# Patient Record
Sex: Female | Born: 1965 | Race: Black or African American | Hispanic: No | Marital: Single | Smoking: Current every day smoker
Health system: Southern US, Community
[De-identification: ages and names within clinical notes are randomized; demographics above are authoritative.]

## PROBLEM LIST (undated history)

## (undated) DIAGNOSIS — IMO0002 Reserved for concepts with insufficient information to code with codable children: Secondary | ICD-10-CM

## (undated) DIAGNOSIS — Z720 Tobacco use: Secondary | ICD-10-CM

## (undated) DIAGNOSIS — E119 Type 2 diabetes mellitus without complications: Secondary | ICD-10-CM

## (undated) DIAGNOSIS — I4729 Other ventricular tachycardia: Secondary | ICD-10-CM

## (undated) DIAGNOSIS — K219 Gastro-esophageal reflux disease without esophagitis: Secondary | ICD-10-CM

## (undated) DIAGNOSIS — I255 Ischemic cardiomyopathy: Secondary | ICD-10-CM

## (undated) DIAGNOSIS — I2119 ST elevation (STEMI) myocardial infarction involving other coronary artery of inferior wall: Secondary | ICD-10-CM

## (undated) DIAGNOSIS — I472 Ventricular tachycardia: Secondary | ICD-10-CM

## (undated) DIAGNOSIS — I10 Essential (primary) hypertension: Secondary | ICD-10-CM

## (undated) DIAGNOSIS — I2510 Atherosclerotic heart disease of native coronary artery without angina pectoris: Secondary | ICD-10-CM

## (undated) DIAGNOSIS — E079 Disorder of thyroid, unspecified: Secondary | ICD-10-CM

## (undated) DIAGNOSIS — E7800 Pure hypercholesterolemia, unspecified: Secondary | ICD-10-CM

## (undated) HISTORY — PX: THYROID SURGERY: SHX805

---

## 2008-05-17 ENCOUNTER — Emergency Department (HOSPITAL_BASED_OUTPATIENT_CLINIC_OR_DEPARTMENT_OTHER): Admission: EM | Admit: 2008-05-17 | Discharge: 2008-05-17 | Payer: Self-pay | Admitting: Emergency Medicine

## 2008-09-07 ENCOUNTER — Emergency Department (HOSPITAL_BASED_OUTPATIENT_CLINIC_OR_DEPARTMENT_OTHER): Admission: EM | Admit: 2008-09-07 | Discharge: 2008-09-08 | Payer: Self-pay | Admitting: Emergency Medicine

## 2008-09-08 ENCOUNTER — Ambulatory Visit: Payer: Self-pay | Admitting: Diagnostic Radiology

## 2009-03-17 ENCOUNTER — Emergency Department (HOSPITAL_BASED_OUTPATIENT_CLINIC_OR_DEPARTMENT_OTHER): Admission: EM | Admit: 2009-03-17 | Discharge: 2009-03-17 | Payer: Self-pay | Admitting: Emergency Medicine

## 2009-09-20 ENCOUNTER — Emergency Department (HOSPITAL_BASED_OUTPATIENT_CLINIC_OR_DEPARTMENT_OTHER): Admission: EM | Admit: 2009-09-20 | Discharge: 2009-09-20 | Payer: Self-pay | Admitting: Emergency Medicine

## 2010-01-06 ENCOUNTER — Emergency Department (HOSPITAL_BASED_OUTPATIENT_CLINIC_OR_DEPARTMENT_OTHER): Admission: EM | Admit: 2010-01-06 | Discharge: 2010-01-06 | Payer: Self-pay | Admitting: Emergency Medicine

## 2010-04-09 ENCOUNTER — Emergency Department (INDEPENDENT_AMBULATORY_CARE_PROVIDER_SITE_OTHER)
Admission: EM | Admit: 2010-04-09 | Discharge: 2010-04-09 | Payer: Self-pay | Source: Home / Self Care | Admitting: Emergency Medicine

## 2010-04-09 DIAGNOSIS — R4182 Altered mental status, unspecified: Secondary | ICD-10-CM

## 2010-04-09 LAB — URINALYSIS, ROUTINE W REFLEX MICROSCOPIC
Bilirubin Urine: NEGATIVE
Hgb urine dipstick: NEGATIVE
Specific Gravity, Urine: 1.021 (ref 1.005–1.030)
Urine Glucose, Fasting: NEGATIVE mg/dL
Urobilinogen, UA: 0.2 mg/dL (ref 0.0–1.0)

## 2010-04-09 LAB — POCT TOXICOLOGY PANEL

## 2010-04-09 LAB — COMPREHENSIVE METABOLIC PANEL
ALT: <6 U/L (ref 0–35)
Alkaline Phosphatase: 57 U/L (ref 39–117)
BUN: 19 mg/dL (ref 6–23)
CO2: 28 mEq/L (ref 19–32)
Calcium: 9.8 mg/dL (ref 8.4–10.5)
Chloride: 100 mEq/L (ref 96–112)
Creatinine, Ser: 1 mg/dL (ref 0.4–1.2)
GFR calc Af Amer: >60 mL/min (ref >60)
Glucose, Bld: 106 mg/dL — ABNORMAL HIGH (ref 70–99)

## 2010-04-09 LAB — CBC
HCT: 41.1 % (ref 36.0–46.0)
Hemoglobin: 14.2 g/dL (ref 12.0–15.0)
MCHC: 34.5 g/dL (ref 30.0–36.0)
MCV: 83.2 fL (ref 78.0–100.0)
RDW: 13.5 % (ref 11.5–15.5)
WBC: 7.1 10*3/uL (ref 4.0–10.5)

## 2010-04-09 LAB — DIFFERENTIAL
Eosinophils Relative: 3 % (ref 0–5)
Lymphocytes Relative: 45 % (ref 12–46)
Lymphs Abs: 3.2 10*3/uL (ref 0.7–4.0)
Monocytes Absolute: 0.6 10*3/uL (ref 0.1–1.0)
Neutro Abs: 3 10*3/uL (ref 1.7–7.7)

## 2010-06-13 ENCOUNTER — Emergency Department (HOSPITAL_BASED_OUTPATIENT_CLINIC_OR_DEPARTMENT_OTHER)
Admission: EM | Admit: 2010-06-13 | Discharge: 2010-06-13 | Disposition: A | Discharge status: Home / Self Care | Payer: Self-pay | Attending: Emergency Medicine | Admitting: Emergency Medicine

## 2010-06-13 DIAGNOSIS — R300 Dysuria: Secondary | ICD-10-CM | POA: Insufficient documentation

## 2010-06-13 DIAGNOSIS — I10 Essential (primary) hypertension: Secondary | ICD-10-CM | POA: Insufficient documentation

## 2010-06-13 DIAGNOSIS — N390 Urinary tract infection, site not specified: Secondary | ICD-10-CM | POA: Insufficient documentation

## 2010-06-13 DIAGNOSIS — K219 Gastro-esophageal reflux disease without esophagitis: Secondary | ICD-10-CM | POA: Insufficient documentation

## 2010-06-13 LAB — URINALYSIS, ROUTINE W REFLEX MICROSCOPIC
Glucose, UA: NEGATIVE mg/dL
pH: 5.5 (ref 5.0–8.0)

## 2010-06-13 LAB — URINE MICROSCOPIC-ADD ON

## 2010-08-24 ENCOUNTER — Emergency Department (HOSPITAL_BASED_OUTPATIENT_CLINIC_OR_DEPARTMENT_OTHER)
Admission: EM | Admit: 2010-08-24 | Discharge: 2010-08-24 | Disposition: A | Discharge status: Home / Self Care | Payer: Self-pay | Attending: Emergency Medicine | Admitting: Emergency Medicine

## 2010-08-24 DIAGNOSIS — R350 Frequency of micturition: Secondary | ICD-10-CM | POA: Insufficient documentation

## 2010-08-24 DIAGNOSIS — I10 Essential (primary) hypertension: Secondary | ICD-10-CM | POA: Insufficient documentation

## 2010-08-24 DIAGNOSIS — K219 Gastro-esophageal reflux disease without esophagitis: Secondary | ICD-10-CM | POA: Insufficient documentation

## 2010-08-24 DIAGNOSIS — N390 Urinary tract infection, site not specified: Secondary | ICD-10-CM | POA: Insufficient documentation

## 2010-08-24 LAB — URINALYSIS, ROUTINE W REFLEX MICROSCOPIC
Bilirubin Urine: NEGATIVE
Hgb urine dipstick: NEGATIVE
Specific Gravity, Urine: 1.025 (ref 1.005–1.030)
Urobilinogen, UA: 1 mg/dL (ref 0.0–1.0)

## 2010-08-24 LAB — URINE MICROSCOPIC-ADD ON

## 2010-08-26 LAB — URINE CULTURE: Culture  Setup Time: 201206121823

## 2010-09-24 ENCOUNTER — Emergency Department (HOSPITAL_BASED_OUTPATIENT_CLINIC_OR_DEPARTMENT_OTHER)
Admission: EM | Admit: 2010-09-24 | Discharge: 2010-09-24 | Disposition: A | Discharge status: Home / Self Care | Payer: Self-pay | Attending: Emergency Medicine | Admitting: Emergency Medicine

## 2010-09-24 DIAGNOSIS — R51 Headache: Secondary | ICD-10-CM | POA: Insufficient documentation

## 2010-09-24 DIAGNOSIS — F17200 Nicotine dependence, unspecified, uncomplicated: Secondary | ICD-10-CM | POA: Insufficient documentation

## 2010-09-24 DIAGNOSIS — T3795XA Adverse effect of unspecified systemic anti-infective and antiparasitic, initial encounter: Secondary | ICD-10-CM | POA: Insufficient documentation

## 2010-09-24 DIAGNOSIS — R220 Localized swelling, mass and lump, head: Secondary | ICD-10-CM | POA: Insufficient documentation

## 2010-09-24 DIAGNOSIS — R221 Localized swelling, mass and lump, neck: Secondary | ICD-10-CM | POA: Insufficient documentation

## 2010-09-24 DIAGNOSIS — T7840XA Allergy, unspecified, initial encounter: Secondary | ICD-10-CM

## 2010-09-24 DIAGNOSIS — R300 Dysuria: Secondary | ICD-10-CM | POA: Insufficient documentation

## 2010-09-24 DIAGNOSIS — L500 Allergic urticaria: Secondary | ICD-10-CM | POA: Insufficient documentation

## 2010-09-24 DIAGNOSIS — N390 Urinary tract infection, site not specified: Secondary | ICD-10-CM | POA: Insufficient documentation

## 2010-09-24 DIAGNOSIS — I10 Essential (primary) hypertension: Secondary | ICD-10-CM | POA: Insufficient documentation

## 2010-09-24 DIAGNOSIS — L299 Pruritus, unspecified: Secondary | ICD-10-CM | POA: Insufficient documentation

## 2010-09-24 HISTORY — DX: Essential (primary) hypertension: I10

## 2010-09-24 HISTORY — DX: Disorder of thyroid, unspecified: E07.9

## 2010-09-24 LAB — URINE MICROSCOPIC-ADD ON

## 2010-09-24 LAB — URINALYSIS, ROUTINE W REFLEX MICROSCOPIC
Bilirubin Urine: NEGATIVE
Glucose, UA: NEGATIVE mg/dL
Specific Gravity, Urine: 1.02 (ref 1.005–1.030)
Urobilinogen, UA: 1 mg/dL (ref 0.0–1.0)
pH: 6 (ref 5.0–8.0)

## 2010-09-24 MED ORDER — DIPHENHYDRAMINE HCL 25 MG PO CAPS
ORAL_CAPSULE | ORAL | Status: AC
  Filled 2010-09-24: qty 1

## 2010-09-24 MED ORDER — DIPHENHYDRAMINE HCL 25 MG PO CAPS: 25.0000 mg | ORAL_CAPSULE | Freq: Four times a day (QID) | ORAL | Status: AC | PRN

## 2010-09-24 MED ORDER — CIPROFLOXACIN HCL 500 MG PO TABS: 500.0000 mg | ORAL_TABLET | Freq: Two times a day (BID) | ORAL | Status: AC

## 2010-09-24 MED ORDER — DIPHENHYDRAMINE HCL 25 MG PO CAPS
25.0000 mg | ORAL_CAPSULE | Freq: Once | ORAL | Status: AC
  Administered 2010-09-24: 25 mg via ORAL
  Filled 2010-09-24: qty 1

## 2010-09-24 MED ORDER — CIPROFLOXACIN HCL 500 MG PO TABS
ORAL_TABLET | ORAL | Status: AC
  Filled 2010-09-24: qty 1

## 2010-09-24 MED ORDER — DIPHENHYDRAMINE HCL 25 MG PO CAPS: 25.0000 mg | ORAL_CAPSULE | Freq: Once | ORAL | Status: AC

## 2010-09-24 MED ORDER — CIPROFLOXACIN HCL 500 MG PO TABS
500.0000 mg | ORAL_TABLET | Freq: Once | ORAL | Status: AC
  Administered 2010-09-24: 500 mg via ORAL

## 2010-09-24 NOTE — ED Notes (Signed)
MD at bedside. 

## 2010-09-24 NOTE — ED Notes (Signed)
Was rx'd macrobid for UTI on 7/10 and states nausea/vomiting and cant eat since taking meds;  Last dose Wed night

## 2010-09-24 NOTE — ED Notes (Signed)
Pt ambulatory to [facility] for urine specimen. 

## 2010-09-24 NOTE — ED Provider Notes (Signed)
History     Chief Complaint  Patient presents with  . Allergic Reaction   HPI Comments: Pt had been seen on June 12 for UTI.  She was prescribed macrodantin.  She could not afford the prescription, and so waited until she had money.  She started the medicine on 09/21/10.  Now she has itching and swelling of the upper lip.  She also feels weak, always cold, has had nausea and headache.  She continues to have dysuria.    Patient is a 45 y.o. female presenting with allergic reaction. The history is provided by the patient. No language interpreter was used.  Allergic Reaction The primary symptoms are  urticaria. The current episode started 13 to 24 hours ago. The problem has not changed since onset. Location: Upper lip.  The onset of the reaction was associated with a new medication.    Past Medical History  Diagnosis Date  . Hypertension   . Thyroid disease     No past surgical history on file.  No family history on file.  History  Substance Use Topics  . Smoking status: Current Everyday Smoker  . Smokeless tobacco: Not on file  . Alcohol Use: Yes     1 beer q 2-3 days    OB History    Grav Para Term Preterm Abortions TAB SAB Ect Mult Living                  Review of Systems  Constitutional: Negative.   HENT: Positive for facial swelling.   Eyes: Negative.   Respiratory: Negative.   Cardiovascular: Negative.   Gastrointestinal: Negative.   Genitourinary: Positive for dysuria.  Skin:       Urticaria on upper lip.  Neurological: Positive for headaches.  Psychiatric/Behavioral: Negative.     Physical Exam  BP 112/74  Pulse 88  Temp(Src) 99.7 F (37.6 C) (Oral)  Resp 16  Physical Exam  Constitutional: She is oriented to person, place, and time. She appears well-developed and well-nourished. No distress.  HENT:  Head: Normocephalic and atraumatic.  Left Ear: External ear normal.  Mouth/Throat: Oropharynx is clear and moist. Oropharyngeal exudate: She has  urticarial swelling of the upper lip.  Eyes: Conjunctivae and EOM are normal. Pupils are equal, round, and reactive to light.  Neck: Normal range of motion. Neck supple.  Cardiovascular: Normal rate, regular rhythm and normal heart sounds.   Pulmonary/Chest: Effort normal and breath sounds normal.  Abdominal: Soft. Bowel sounds are normal.  Neurological: She is alert and oriented to person, place, and time.  Skin: Skin is warm and dry.  Psychiatric: She has a normal mood and affect. Her behavior is normal.    ED Course  Procedures  MDM  Rx for allergic reaction with Benadryl.  Reviewed sensitivities from her urine culture, and changed her to Cipro.      Carleene Cooper III, MD 09/24/10 [phone]

## 2010-09-26 LAB — URINE CULTURE
Colony Count: >=100000
Culture  Setup Time: 201207131419

## 2010-09-27 NOTE — ED Notes (Signed)
+    Urine culture. Patient treated with cipro-sensitive to same -chart appended per protocol MD.

## 2010-10-08 ENCOUNTER — Emergency Department (HOSPITAL_BASED_OUTPATIENT_CLINIC_OR_DEPARTMENT_OTHER)
Admission: EM | Admit: 2010-10-08 | Discharge: 2010-10-08 | Disposition: A | Discharge status: Home / Self Care | Payer: Self-pay | Attending: Emergency Medicine | Admitting: Emergency Medicine

## 2010-10-08 ENCOUNTER — Encounter (HOSPITAL_BASED_OUTPATIENT_CLINIC_OR_DEPARTMENT_OTHER): Payer: Self-pay

## 2010-10-08 DIAGNOSIS — R509 Fever, unspecified: Secondary | ICD-10-CM | POA: Insufficient documentation

## 2010-10-08 DIAGNOSIS — N390 Urinary tract infection, site not specified: Secondary | ICD-10-CM | POA: Insufficient documentation

## 2010-10-08 DIAGNOSIS — R350 Frequency of micturition: Secondary | ICD-10-CM | POA: Insufficient documentation

## 2010-10-08 DIAGNOSIS — R300 Dysuria: Secondary | ICD-10-CM | POA: Insufficient documentation

## 2010-10-08 LAB — URINALYSIS, ROUTINE W REFLEX MICROSCOPIC
Bilirubin Urine: NEGATIVE
Nitrite: POSITIVE — AB
Specific Gravity, Urine: 1.025 (ref 1.005–1.030)
Urobilinogen, UA: 1 mg/dL (ref 0.0–1.0)
pH: 6 (ref 5.0–8.0)

## 2010-10-08 LAB — URINE MICROSCOPIC-ADD ON

## 2010-10-08 MED ORDER — NAPROXEN SODIUM 220 MG PO TABS: 220.0000 mg | ORAL_TABLET | Freq: Two times a day (BID) | ORAL | Status: DC

## 2010-10-08 MED ORDER — CIPROFLOXACIN HCL 500 MG PO TABS: 500.0000 mg | ORAL_TABLET | Freq: Two times a day (BID) | ORAL | Status: AC

## 2010-10-08 MED ORDER — CIPROFLOXACIN HCL 500 MG PO TABS
500.0000 mg | ORAL_TABLET | Freq: Once | ORAL | Status: AC
  Administered 2010-10-08: 500 mg via ORAL
  Filled 2010-10-08: qty 1

## 2010-10-08 NOTE — ED Notes (Signed)
MD at bedside. 

## 2010-10-08 NOTE — ED Notes (Signed)
Urine collected in triage and taken to room with patient.

## 2010-10-08 NOTE — ED Provider Notes (Signed)
History     Chief Complaint  Patient presents with  . Dysuria  . Urinary Frequency  . Fever   HPI Comments: Presents today with continued urinary symptoms after losing her prescription for ciprofloxacin. Was seen here 2 weeks ago and placed on Macrobid. Developed an allergic reaction. At that visit was placed on Cipro however the patient lost the prescription. She continues to have dysuria, frequency, urgency. Subjective fevers but has not checked at home. She denies abdominal pain, nausea, vomiting, chest pain, shortness of breath  Patient is a 45 y.o. female presenting with dysuria. The history is provided by the patient. No language interpreter was used.  Dysuria  This is a recurrent problem. The current episode started more than 1 week ago (has been present for about 2 weeks). The problem occurs every urination. The problem has not changed since onset.The quality of the pain is described as burning. The pain is mild. There has been no fever. She is sexually active. Associated symptoms include frequency, hesitancy and urgency. Pertinent negatives include no chills, no sweats, no nausea, no vomiting, no hematuria and no flank pain. She has tried nothing for the symptoms. Her past medical history does not include kidney stones.    Past Medical History  Diagnosis Date  . Hypertension   . Thyroid disease     History reviewed. No pertinent past surgical history.  History reviewed. No pertinent family history.  History  Substance Use Topics  . Smoking status: Current Everyday Smoker  . Smokeless tobacco: Not on file  . Alcohol Use: Yes     1 beer q 2-3 days    OB History    Grav Para Term Preterm Abortions TAB SAB Ect Mult Living                  Review of Systems  Constitutional: Negative for fever, chills, activity change and appetite change.  HENT: Negative for congestion, sore throat, rhinorrhea, neck pain and neck stiffness.   Respiratory: Negative for chest tightness and  shortness of breath.   Gastrointestinal: Negative for nausea, vomiting and abdominal pain.  Genitourinary: Positive for dysuria, hesitancy, urgency and frequency. Negative for hematuria, flank pain, vaginal bleeding and vaginal discharge.  Neurological: Negative for light-headedness and headaches.  All other systems reviewed and are negative.    Physical Exam  BP 104/71  Pulse 95  Temp(Src) 98.8 F (37.1 C) (Oral)  Wt 125 lb (56.7 kg)  SpO2 100%  Physical Exam  Nursing note and vitals reviewed. Constitutional: She is oriented to person, place, and time. She appears well-developed and well-nourished. No distress.  HENT:  Head: Normocephalic and atraumatic.  Mouth/Throat: Oropharynx is clear and moist.  Eyes: Conjunctivae and EOM are normal. Pupils are equal, round, and reactive to light.  Neck: Normal range of motion. Neck supple.  Cardiovascular: Normal rate, regular rhythm, normal heart sounds and intact distal pulses.   No murmur heard. Pulmonary/Chest: Effort normal and breath sounds normal. No respiratory distress.  Abdominal: Soft. There is no tenderness.  Musculoskeletal: Normal range of motion.  Neurological: She is alert and oriented to person, place, and time.  Skin: Skin is warm. No rash noted.    ED Course  Procedures  MDM Urinary tract infection  Urinalysis is consistent with a urinary tract infection. Urine culture will be sent. First dose of Cipro will be administered numerous department. She'll be discharged home with a prescription for Cipro and encouraged to fill after leaving the emergency department. No  objective findings to suggest pyelonephritis although given the length of time the patient's symptoms I will treat with a longer course of Cipro.     Dayton Bailiff, MD 10/08/10 [phone]

## 2010-10-08 NOTE — ED Notes (Signed)
Pt reports she was seen on 09/21/10 in ED, diagnosed with a UTI and given Macrobid.  She developed an allergic reaction and was seen again in the ED on 09/24/2010.  States she received an Rx for ABX but was unable to get it filled for financial reasons and since has lost the Rx. SHe continues to have urinary frequency and burning with urination, as well as fever and chills.

## 2010-10-10 LAB — URINE CULTURE: Colony Count: >=100000

## 2010-10-11 NOTE — ED Notes (Signed)
+   urine Patient treated with Cipro-sensitive to same-chart appended per protocol MD. 

## 2010-12-09 ENCOUNTER — Emergency Department (HOSPITAL_BASED_OUTPATIENT_CLINIC_OR_DEPARTMENT_OTHER)
Admission: EM | Admit: 2010-12-09 | Discharge: 2010-12-09 | Disposition: A | Discharge status: Home / Self Care | Payer: Self-pay | Attending: Emergency Medicine | Admitting: Emergency Medicine

## 2010-12-09 ENCOUNTER — Encounter (HOSPITAL_BASED_OUTPATIENT_CLINIC_OR_DEPARTMENT_OTHER): Payer: Self-pay | Admitting: *Deleted

## 2010-12-09 DIAGNOSIS — S76219A Strain of adductor muscle, fascia and tendon of unspecified thigh, initial encounter: Secondary | ICD-10-CM

## 2010-12-09 DIAGNOSIS — M79609 Pain in unspecified limb: Secondary | ICD-10-CM | POA: Insufficient documentation

## 2010-12-09 DIAGNOSIS — X503XXA Overexertion from repetitive movements, initial encounter: Secondary | ICD-10-CM | POA: Insufficient documentation

## 2010-12-09 DIAGNOSIS — Y9289 Other specified places as the place of occurrence of the external cause: Secondary | ICD-10-CM | POA: Insufficient documentation

## 2010-12-09 DIAGNOSIS — IMO0002 Reserved for concepts with insufficient information to code with codable children: Secondary | ICD-10-CM | POA: Insufficient documentation

## 2010-12-09 DIAGNOSIS — F17200 Nicotine dependence, unspecified, uncomplicated: Secondary | ICD-10-CM | POA: Insufficient documentation

## 2010-12-09 DIAGNOSIS — E079 Disorder of thyroid, unspecified: Secondary | ICD-10-CM | POA: Insufficient documentation

## 2010-12-09 DIAGNOSIS — I10 Essential (primary) hypertension: Secondary | ICD-10-CM | POA: Insufficient documentation

## 2010-12-09 MED ORDER — IBUPROFEN 800 MG PO TABS
800.0000 mg | ORAL_TABLET | Freq: Once | ORAL | Status: AC
  Administered 2010-12-09: 800 mg via ORAL
  Filled 2010-12-09: qty 1

## 2010-12-09 MED ORDER — NAPROXEN 500 MG PO TABS: 500.0000 mg | ORAL_TABLET | Freq: Two times a day (BID) | ORAL | Status: DC

## 2010-12-09 MED ORDER — AMLODIPINE BESYLATE 10 MG PO TABS: 10.0000 mg | ORAL_TABLET | Freq: Every day | ORAL | Status: DC

## 2010-12-09 MED ORDER — TRAMADOL HCL 50 MG PO TABS
50.0000 mg | ORAL_TABLET | Freq: Once | ORAL | Status: AC
  Administered 2010-12-09: 50 mg via ORAL
  Filled 2010-12-09: qty 1

## 2010-12-09 MED ORDER — TRAMADOL HCL 50 MG PO TABS: 50.0000 mg | ORAL_TABLET | Freq: Four times a day (QID) | ORAL | Status: AC | PRN

## 2010-12-09 NOTE — ED Provider Notes (Signed)
History     CSN: [id number] Arrival date & time: 12/09/2010  8:02 AM  Chief Complaint  Patient presents with  . Leg Pain    (Consider location/radiation/quality/duration/timing/severity/associated sxs/prior treatment) HPI Comments: Patient works in a [employer], [employer]. Awoke this morning with pain to her right groin area. Has not tried any alleviating measures. No known trauma to her right hip. There is no appreciable swelling. No additional pain. No known injury  Patient is a 45 y.o. female presenting with leg pain. The history is provided by the patient. No language interpreter was used.  Leg Pain  The incident occurred 1 to 2 hours ago (awoke with pain this am). The incident occurred at home. There was no injury mechanism. The pain is present in the right hip. The quality of the pain is described as sharp and throbbing. The pain is moderate. The pain has been constant since onset. Pertinent negatives include no numbness, no inability to bear weight, no loss of motion, no muscle weakness, no loss of sensation and no tingling. She reports no foreign bodies present. The symptoms are aggravated by palpation, bearing weight and activity. She has tried nothing for the symptoms. The treatment provided no relief.    Past Medical History  Diagnosis Date  . Hypertension   . Thyroid disease     History reviewed. No pertinent past surgical history.  History reviewed. No pertinent family history.  History  Substance Use Topics  . Smoking status: Current Everyday Smoker  . Smokeless tobacco: Not on file  . Alcohol Use: Yes     1 beer q 2-3 days    OB History    Grav Para Term Preterm Abortions TAB SAB Ect Mult Living                  Review of Systems  Constitutional: Negative for fever, activity change and appetite change.  HENT: Negative for congestion, rhinorrhea, neck pain and neck stiffness.   Respiratory: Negative for cough, chest tightness and shortness of breath.     Cardiovascular: Negative for chest pain and palpitations.  Gastrointestinal: Negative for nausea, vomiting, abdominal pain and diarrhea.  Genitourinary: Negative for dysuria, urgency, frequency and flank pain.  Musculoskeletal: Negative for back pain.  Skin: Negative for rash.  Neurological: Negative for dizziness, tingling, numbness and headaches.  All other systems reviewed and are negative.    Allergies  Macrobid and Penicillins  Home Medications   Current Outpatient Rx  Name Route Sig Dispense Refill  . AMLODIPINE BESY-BENAZEPRIL HCL 5-10 MG PO CAPS Oral Take 1 capsule by mouth daily.      Marland Kitchen LEVOTHYROXINE SODIUM 25 MCG PO TABS Oral Take 25 mcg by mouth daily.      Marland Kitchen NAPROXEN SODIUM 220 MG PO TABS Oral Take 1 tablet (220 mg total) by mouth 2 (two) times daily with a meal. 30 tablet 0  . NITROFURANTOIN MONOHYD MACRO 100 MG PO CAPS Oral Take 100 mg by mouth 2 (two) times daily.      Marland Kitchen SPIRONOLACTONE 25 MG PO TABS Oral Take 25 mg by mouth daily.        BP 105/56  Pulse 81  Temp(Src) 98.5 F (36.9 C) (Oral)  Resp 20  SpO2 100%  LMP 12/08/2010  Physical Exam  Nursing note and vitals reviewed. Constitutional: She appears well-developed and well-nourished. No distress.       Uncomfortable in appearance  HENT:  Head: Normocephalic and atraumatic.  Mouth/Throat: No oropharyngeal exudate.  Eyes:  Conjunctivae and EOM are normal. Pupils are equal, round, and reactive to light.  Neck: Normal range of motion. Neck supple.  Cardiovascular: Normal rate, regular rhythm, normal heart sounds and intact distal pulses.   Pulmonary/Chest: Effort normal and breath sounds normal. No respiratory distress. She has no rales.  Abdominal: Soft. Bowel sounds are normal. There is no tenderness.  Musculoskeletal: Normal range of motion. She exhibits tenderness (Pain on palpation of the gracilis and sartorius muscles. There is no appreciable swelling. There is some palpable spasm. There is no  redness. Full range of motion of the hip without significant pain).  Skin: Skin is warm and dry. No rash noted.    ED Course  Procedures (including critical care time)  Labs Reviewed - No data to display No results found.   No diagnosis found.    MDM  A right groin strain  Her exam findings are consistent with a right groin strain. I'm not concerned about a hip fracture. Her pain is limited to the right groin therefore I am not concerned about a deep venous thrombosis. There is no indication for imaging at this time. I discussed the importance of ice and heat. I'll prescribe Naprosyn as well as a short course of Ultram. She is instructed to followup with her primary care physician        Dayton Bailiff, MD 12/09/10 [phone]

## 2010-12-09 NOTE — ED Notes (Signed)
Pt states she woke up this am with severe upper right thigh pain states she hasnt injured it as far as she knows . Pt states too painful to stand up. No swelling noted.

## 2011-04-08 ENCOUNTER — Emergency Department (INDEPENDENT_AMBULATORY_CARE_PROVIDER_SITE_OTHER): Payer: Self-pay

## 2011-04-08 ENCOUNTER — Emergency Department (HOSPITAL_BASED_OUTPATIENT_CLINIC_OR_DEPARTMENT_OTHER)
Admission: EM | Admit: 2011-04-08 | Discharge: 2011-04-08 | Disposition: A | Discharge status: Home / Self Care | Payer: Self-pay | Attending: Emergency Medicine | Admitting: Emergency Medicine

## 2011-04-08 ENCOUNTER — Encounter (HOSPITAL_BASED_OUTPATIENT_CLINIC_OR_DEPARTMENT_OTHER): Payer: Self-pay | Admitting: Family Medicine

## 2011-04-08 ENCOUNTER — Other Ambulatory Visit: Payer: Self-pay

## 2011-04-08 DIAGNOSIS — R079 Chest pain, unspecified: Secondary | ICD-10-CM

## 2011-04-08 DIAGNOSIS — F17200 Nicotine dependence, unspecified, uncomplicated: Secondary | ICD-10-CM | POA: Insufficient documentation

## 2011-04-08 DIAGNOSIS — I10 Essential (primary) hypertension: Secondary | ICD-10-CM | POA: Insufficient documentation

## 2011-04-08 DIAGNOSIS — E079 Disorder of thyroid, unspecified: Secondary | ICD-10-CM | POA: Insufficient documentation

## 2011-04-08 HISTORY — DX: Reserved for concepts with insufficient information to code with codable children: IMO0002

## 2011-04-08 LAB — BASIC METABOLIC PANEL
BUN: 14 mg/dL (ref 6–23)
CO2: 29 mEq/L (ref 19–32)
Calcium: 10.1 mg/dL (ref 8.4–10.5)
Chloride: 98 mEq/L (ref 96–112)
Creatinine, Ser: 1 mg/dL (ref 0.50–1.10)
GFR calc Af Amer: 77 mL/min — ABNORMAL LOW (ref >90)
GFR calc non Af Amer: 66 mL/min — ABNORMAL LOW (ref >90)
Glucose, Bld: 119 mg/dL — ABNORMAL HIGH (ref 70–99)
Potassium: 3.9 mEq/L (ref 3.5–5.1)
Sodium: 136 mEq/L (ref 135–145)

## 2011-04-08 LAB — CBC
HCT: 41.9 % (ref 36.0–46.0)
Hemoglobin: 14.4 g/dL (ref 12.0–15.0)
MCH: 29.4 pg (ref 26.0–34.0)
MCHC: 34.4 g/dL (ref 30.0–36.0)
MCV: 85.5 fL (ref 78.0–100.0)
Platelets: 276 10*3/uL (ref 150–400)
RBC: 4.9 MIL/uL (ref 3.87–5.11)
RDW: 13.9 % (ref 11.5–15.5)
WBC: 7.9 10*3/uL (ref 4.0–10.5)

## 2011-04-08 LAB — TROPONIN I: Troponin I: <0.3 ng/mL (ref <0.30)

## 2011-04-08 MED ORDER — ASPIRIN 81 MG PO CHEW
324.0000 mg | CHEWABLE_TABLET | Freq: Once | ORAL | Status: AC
  Administered 2011-04-08: 324 mg via ORAL
  Filled 2011-04-08: qty 4

## 2011-04-08 NOTE — ED Provider Notes (Signed)
History    45yF with chest pain. Pain is under the left breast. Describes the pain as sharp. Last 30 seconds to about a minute. No appreciable exacerbating or relieving factors. No shortness of breath. No radiation. Currently does not have. No fevers or chills. No cough. No unusual leg pain or swelling. Denies history of blood clots. Patient is a smoker. Denies exogenous estrogen use. No nausea, vomiting or diaphoresis. No rash or other skin changes.  CSN: [id number]  Arrival date & time 04/08/11  1119   First MD Initiated Contact with Patient 04/08/11 1139      Chief Complaint  Patient presents with  . Chest Pain    (Consider location/radiation/quality/duration/timing/severity/associated sxs/prior treatment) HPI  Past Medical History  Diagnosis Date  . Hypertension   . Thyroid disease   . Ulcer     Past Surgical History  Procedure Date  . Thyroid surgery     No family history on file.  History  Substance Use Topics  . Smoking status: Current Everyday Smoker  . Smokeless tobacco: Not on file  . Alcohol Use: Yes     1 beer q 2-3 days    OB History    Grav Para Term Preterm Abortions TAB SAB Ect Mult Living                  Review of Systems   Review of symptoms negative unless otherwise noted in HPI.   Allergies  Macrobid; Ciprofloxacin; and Penicillins  Home Medications   Current Outpatient Rx  Name Route Sig Dispense Refill  . LEVOTHYROXINE SODIUM 50 MCG PO TABS Oral Take 50 mcg by mouth daily.    Marland Kitchen NAPROXEN SODIUM 220 MG PO TABS Oral Take 220 mg by mouth 2 (two) times daily between meals as needed. For knee pain    . OMEPRAZOLE 20 MG PO CPDR Oral Take 20 mg by mouth 2 (two) times daily.    Marland Kitchen AMLODIPINE BESYLATE 10 MG PO TABS Oral Take 1 tablet (10 mg total) by mouth daily. 7 tablet 0  . AMLODIPINE BESY-BENAZEPRIL HCL 5-10 MG PO CAPS Oral Take 1 capsule by mouth daily.        BP 131/89  Pulse 85  Temp 97.8 F (36.6 C)  Resp 16  Ht 5' (1.524 m)   Wt 140 lb (63.504 kg)  BMI 27.34 kg/m2  SpO2 100%  LMP 03/08/2011  Physical Exam  Nursing note and vitals reviewed. Constitutional: She appears well-developed and well-nourished. No distress.       Sitting in bed. No acute distress.  HENT:  Head: Normocephalic and atraumatic.  Eyes: Conjunctivae are normal. Right eye exhibits no discharge. Left eye exhibits no discharge.  Neck: Normal range of motion. Neck supple.  Cardiovascular: Normal rate, regular rhythm and normal heart sounds.  Exam reveals no gallop and no friction rub.   No murmur heard.      Chest pain is not reproducible on palpation  Pulmonary/Chest: Effort normal and breath sounds normal. No respiratory distress.  Abdominal: Soft. She exhibits no distension. There is no tenderness.  Musculoskeletal: She exhibits no edema and no tenderness.  Lymphadenopathy:    She has no cervical adenopathy.  Neurological: She is alert.       Lower extremities symmetric in appearance. No edema. No calf tenderness. Negative Homans sign.  Skin: Skin is warm and dry. She is not diaphoretic.  Psychiatric: She has a normal mood and affect. Her behavior is normal. Thought content normal.  ED Course  Procedures (including critical care time)  Labs Reviewed  BASIC METABOLIC PANEL - Abnormal; Notable for the following:    Glucose, Bld 119 (*)    GFR calc non Af Amer 66 (*)    GFR calc Af Amer 77 (*)    All other components within normal limits  TROPONIN I  CBC   No results found.  EKG:  Rhythm: Normal sinus rhythm Rate: 72 Axis: Normal Intervals: Left atrial enlargement ST segments: Nonspecific ST changes. T wave flattening anteriorly. This is not significantly changed from patient's prior EKG.  12:15 PM. Chest x-ray was personally reviewed. Surgical clips in the neck area likely secondary to thyroidectomy. Bones are unremarkable. Trachea is midline. Heart is normal in appearance. Lungs are clear. No pleural effusions or  pneumothorax.  1. Chest pain       MDM  45yF with CP. Consider ACS but doubt. Chest pain is atypical in nature. Troponin is normal. EKG is not suggestive. No significant change in EKG from prior. Consider pulmonary embolism but doubt. Patient has no complaints of dyspnea, not tachycardic and no hypoxia. She does not use exogenous estrogen. No findings on physical exam to suggest DVT.Doubt infectious. Chest x-ray is clear. Etiology of chest pain is unclear at this point time but I feel the patient has been appropriate screened for urgent/emergent process. Discussed outpatient followup and the need to get cardiac stress testing. Do not feel that patient needs admission to hospital at this  Time though for further evaluation.      Raeford Razor, MD 04/08/11 1348

## 2011-04-08 NOTE — ED Notes (Signed)
Pt c/o chest pain, onset yesterday, under left breast that is intermittent lasting about 1 min at a time and pt sts "it feels like gas or a fullness". Pt denies cp at this time.  Pt denies associated symptoms. Pt sts she is currently being treated for "an ulcer".

## 2011-05-14 ENCOUNTER — Encounter (HOSPITAL_BASED_OUTPATIENT_CLINIC_OR_DEPARTMENT_OTHER): Payer: Self-pay | Admitting: *Deleted

## 2011-05-14 ENCOUNTER — Emergency Department (HOSPITAL_BASED_OUTPATIENT_CLINIC_OR_DEPARTMENT_OTHER)
Admission: EM | Admit: 2011-05-14 | Discharge: 2011-05-14 | Disposition: A | Discharge status: Home / Self Care | Payer: Self-pay | Attending: Emergency Medicine | Admitting: Emergency Medicine

## 2011-05-14 ENCOUNTER — Emergency Department (INDEPENDENT_AMBULATORY_CARE_PROVIDER_SITE_OTHER): Payer: Self-pay

## 2011-05-14 DIAGNOSIS — Z79899 Other long term (current) drug therapy: Secondary | ICD-10-CM | POA: Insufficient documentation

## 2011-05-14 DIAGNOSIS — R51 Headache: Secondary | ICD-10-CM | POA: Insufficient documentation

## 2011-05-14 DIAGNOSIS — I10 Essential (primary) hypertension: Secondary | ICD-10-CM | POA: Insufficient documentation

## 2011-05-14 DIAGNOSIS — H539 Unspecified visual disturbance: Secondary | ICD-10-CM | POA: Insufficient documentation

## 2011-05-14 DIAGNOSIS — J329 Chronic sinusitis, unspecified: Secondary | ICD-10-CM

## 2011-05-14 LAB — BASIC METABOLIC PANEL
Calcium: 9.3 mg/dL (ref 8.4–10.5)
Chloride: 96 mEq/L (ref 96–112)
Creatinine, Ser: 1 mg/dL (ref 0.50–1.10)
GFR calc Af Amer: 77 mL/min — ABNORMAL LOW (ref >90)
Sodium: 134 mEq/L — ABNORMAL LOW (ref 135–145)

## 2011-05-14 LAB — GLUCOSE, CAPILLARY: Glucose-Capillary: 81 mg/dL (ref 70–99)

## 2011-05-14 NOTE — ED Notes (Signed)
Pt states she has a hx of HTN and was recently taken off Spirolactone. For the past couple days, she has been monitoring her BP and it has been elevated. 147-149/93. Also c/o H/A "pulsating" and visual disturbances "lines" Denies other s/s.

## 2011-05-14 NOTE — ED Provider Notes (Signed)
History     CSN: [id number]  Arrival date & time 05/14/11  1810   First MD Initiated Contact with Patient 05/14/11 1827      Chief Complaint  Patient presents with  . Hypertension    (Consider location/radiation/quality/duration/timing/severity/associated sxs/prior treatment) HPI Comments: Pt states that she was taken off her spironolactone 3 weeks ago:pt states that she was supposed to follow up with her pcp last week but they canceled her appt:pt states that in the last week she has intermittent headaches and has had some lines in her vision:pt denies any loss or blurred vision:pt states that she has been taking bp at local drug store and it has been 140/90:pt denies cp or sob  Patient is a 46 y.o. female presenting with hypertension. The history is provided by the patient. No language interpreter was used.  Hypertension This is a chronic problem. The current episode started 1 to 4 weeks ago. The problem occurs intermittently. The problem has been unchanged. Associated symptoms include headaches. Pertinent negatives include no nausea or visual change. The symptoms are aggravated by nothing. She has tried nothing for the symptoms.    Past Medical History  Diagnosis Date  . Hypertension   . Thyroid disease   . Ulcer     Past Surgical History  Procedure Date  . Thyroid surgery     History reviewed. No pertinent family history.  History  Substance Use Topics  . Smoking status: Current Everyday Smoker  . Smokeless tobacco: Not on file  . Alcohol Use: Yes     1 beer q 2-3 days    OB History    Grav Para Term Preterm Abortions TAB SAB Ect Mult Living                  Review of Systems  Gastrointestinal: Negative for nausea.  Neurological: Positive for headaches.  All other systems reviewed and are negative.    Allergies  Macrobid; Ciprofloxacin; and Penicillins  Home Medications   Current Outpatient Rx  Name Route Sig Dispense Refill  . AMLODIPINE BESYLATE 10  MG PO TABS Oral Take 1 tablet (10 mg total) by mouth daily. 7 tablet 0  . LEVOTHYROXINE SODIUM 50 MCG PO TABS Oral Take 50 mcg by mouth daily.    Marland Kitchen OMEPRAZOLE 20 MG PO CPDR Oral Take 20 mg by mouth 2 (two) times daily.      BP 147/91  Pulse 84  Temp(Src) 98 F (36.7 C) (Oral)  Resp 20  Ht 5' (1.524 m)  Wt 150 lb (68.04 kg)  BMI 29.30 kg/m2  SpO2 100%  LMP 05/08/2011  Physical Exam  Nursing note and vitals reviewed. Constitutional: She is oriented to person, place, and time. She appears well-developed and well-nourished.  HENT:  Head: Normocephalic and atraumatic.  Right Ear: External ear normal.  Left Ear: External ear normal.  Eyes: Conjunctivae and EOM are normal. Pupils are equal, round, and reactive to light.  Neck: Normal range of motion. Neck supple.  Cardiovascular: Normal rate and regular rhythm.   Pulmonary/Chest: Effort normal and breath sounds normal.  Abdominal: Soft. Bowel sounds are normal. She exhibits no distension.  Musculoskeletal: Normal range of motion.  Neurological: She is alert and oriented to person, place, and time.  Skin: Skin is warm and dry.  Psychiatric: She has a normal mood and affect.    ED Course  Procedures (including critical care time)  Labs Reviewed  BASIC METABOLIC PANEL - Abnormal; Notable for the following:  Sodium 134 (*)    Potassium 3.4 (*)    GFR calc non Af Amer 66 (*)    GFR calc Af Amer 77 (*)    All other components within normal limits  GLUCOSE, CAPILLARY   Ct Head Wo Contrast  05/14/2011  *RADIOLOGY REPORT*  Clinical Data: Headache.  Hypertension  CT HEAD WITHOUT CONTRAST  Technique:  Contiguous axial images were obtained from the base of the skull through the vertex without contrast.  Comparison: 04/09/2010  Findings: Ventricles are normal.  Negative for intracranial hemorrhage.  Negative for infarct or mass.  Mild chronic sinusitis left maxillary sinus.  IMPRESSION: No significant intracranial abnormality.  Mild  chronic sinusitis.  Original Report Authenticated By: Camelia Phenes, M.D.     1. Hypertension   2. Headache       MDM  Pt neurologically intact:discussed with pt that we don't need to change her medications at this time:that she can follow up with her MWN:[id number] taking tylenol or motrin for the headache        Teressa Lower, NP 05/14/11 2016

## 2011-05-14 NOTE — Discharge Instructions (Signed)
Headache, General, Unknown Cause The specific cause of your headache may not have been found today. There are many causes and types of headache. A few common ones are:  Tension headache.   Migraine.   Infections (examples: dental and sinus infections).   Bone and/or joint problems in the neck or jaw.   Depression.   Eye problems.  These headaches are not life threatening.  Headaches can sometimes be diagnosed by a patient history and a physical exam. Sometimes, lab and imaging studies (such as x-ray and/or CT scan) are used to rule out more serious problems. In some cases, a spinal tap (lumbar puncture) may be requested. There are many times when your exam and tests may be normal on the first visit even when there is a serious problem causing your headaches. Because of that, it is very important to follow up with your doctor or local clinic for further evaluation. FINDING OUT THE RESULTS OF TESTS  If a radiology test was performed, a radiologist will review your results.   You will be contacted by the emergency department or your physician if any test results require a change in your treatment plan.   Not all test results may be available during your visit. If your test results are not back during the visit, make an appointment with your caregiver to find out the results. Do not assume everything is normal if you have not heard from your caregiver or the medical facility. It is important for you to follow up on all of your test results.  HOME CARE INSTRUCTIONS   Keep follow-up appointments with your caregiver, or any specialist referral.   Only take over-the-counter or prescription medicines for pain, discomfort, or fever as directed by your caregiver.   Biofeedback, massage, or other relaxation techniques may be helpful.   Ice packs or heat applied to the head and neck can be used. Do this three to four times per day, or as needed.   Call your doctor if you have any questions or  concerns.   If you smoke, you should quit.  SEEK MEDICAL CARE IF:   You develop problems with medications prescribed.   You do not respond to or obtain relief from medications.   You have a change from the usual headache.   You develop nausea or vomiting.  SEEK IMMEDIATE MEDICAL CARE IF:   If your headache becomes severe.   You have an unexplained oral temperature above 102 F (38.9 C), or as your caregiver suggests.   You have a stiff neck.   You have loss of vision.   You have muscular weakness.   You have loss of muscular control.   You develop severe symptoms different from your first symptoms.   You start losing your balance or have trouble walking.   You feel faint or pass out.  MAKE SURE YOU:   Understand these instructions.   Will watch your condition.   Will get help right away if you are not doing well or get worse.  Document Released: 02/28/2005 Document Revised: 11/10/2010 Document Reviewed: 10/18/2007 [facility] Patient Information 2012 [place], [place].Hypertension Information As your heart beats, it forces blood through your arteries. This force is your blood pressure. If the pressure is too high, it is called hypertension (HTN) or high blood pressure. HTN is dangerous because you may have it and not know it. High blood pressure may mean that your heart has to work harder to pump blood. Your arteries may be narrow or  stiff. The extra work puts you at risk for heart disease, stroke, and other problems.  Blood pressure consists of two numbers, a higher number over a lower, 110/72, for example. It is stated as "110 over 72." The ideal is below 120 for the top number (systolic) and under 80 for the bottom (diastolic).  You should pay close attention to your blood pressure if you have certain conditions such as:  Heart failure.   Prior heart attack.   Diabetes   Chronic kidney disease.   Prior stroke.   Multiple risk factors for heart disease.  To see if  you have HTN, your blood pressure should be measured while you are seated with your arm held at the level of the heart. It should be measured at least twice. A one-time elevated blood pressure reading (especially in the Emergency Department) does not mean that you need treatment. There may be conditions in which the blood pressure is different between your right and left arms. It is important to see your caregiver soon for a recheck. Most people have essential hypertension which means that there is not a specific cause. This type of high blood pressure may be lowered by changing lifestyle factors such as:  Stress.   Smoking.   Lack of exercise.   Excessive weight.   Drug/tobacco/alcohol use.   Eating less salt.  Most people do not have symptoms from high blood pressure until it has caused damage to the body. Effective treatment can often prevent, delay or reduce that damage. TREATMENT  Treatment for high blood pressure, when a cause has been identified, is directed at the cause. There are a large number of medications to treat HTN. These fall into several categories, and your caregiver will help you select the medicines that are best for you. Medications may have side effects. You should review side effects with your caregiver. If your blood pressure stays high after you have made lifestyle changes or started on medicines,   Your medication(s) may need to be changed.   Other problems may need to be addressed.   Be certain you understand your prescriptions, and know how and when to take your medicine.   Be sure to follow up with your caregiver within the time frame advised (usually within two weeks) to have your blood pressure rechecked and to review your medications.   If you are taking more than one medicine to lower your blood pressure, make sure you know how and at what times they should be taken. Taking two medicines at the same time can result in blood pressure that is too low.    Document Released: 05/03/2005 Document Revised: 11/10/2010 Document Reviewed: 05/10/2007 [facility] Patient Information 2012 [place], [place].

## 2011-05-14 NOTE — ED Provider Notes (Signed)
Medical screening examination/treatment/procedure(s) were performed by non-physician practitioner and as supervising physician I was immediately available for consultation/collaboration.   Jakaya Jacobowitz, MD 05/14/11 2017 

## 2013-02-27 ENCOUNTER — Emergency Department (HOSPITAL_BASED_OUTPATIENT_CLINIC_OR_DEPARTMENT_OTHER): Payer: 59

## 2013-02-27 ENCOUNTER — Emergency Department (HOSPITAL_BASED_OUTPATIENT_CLINIC_OR_DEPARTMENT_OTHER)
Admission: EM | Admit: 2013-02-27 | Discharge: 2013-02-27 | Disposition: A | Discharge status: Home / Self Care | Payer: 59 | Attending: Emergency Medicine | Admitting: Emergency Medicine

## 2013-02-27 ENCOUNTER — Encounter (HOSPITAL_BASED_OUTPATIENT_CLINIC_OR_DEPARTMENT_OTHER): Payer: Self-pay | Admitting: Emergency Medicine

## 2013-02-27 DIAGNOSIS — F17200 Nicotine dependence, unspecified, uncomplicated: Secondary | ICD-10-CM | POA: Insufficient documentation

## 2013-02-27 DIAGNOSIS — E7800 Pure hypercholesterolemia, unspecified: Secondary | ICD-10-CM | POA: Insufficient documentation

## 2013-02-27 DIAGNOSIS — Z872 Personal history of diseases of the skin and subcutaneous tissue: Secondary | ICD-10-CM | POA: Insufficient documentation

## 2013-02-27 DIAGNOSIS — Z3202 Encounter for pregnancy test, result negative: Secondary | ICD-10-CM | POA: Insufficient documentation

## 2013-02-27 DIAGNOSIS — K219 Gastro-esophageal reflux disease without esophagitis: Secondary | ICD-10-CM | POA: Insufficient documentation

## 2013-02-27 DIAGNOSIS — E079 Disorder of thyroid, unspecified: Secondary | ICD-10-CM | POA: Insufficient documentation

## 2013-02-27 DIAGNOSIS — I10 Essential (primary) hypertension: Secondary | ICD-10-CM | POA: Insufficient documentation

## 2013-02-27 DIAGNOSIS — J40 Bronchitis, not specified as acute or chronic: Secondary | ICD-10-CM

## 2013-02-27 DIAGNOSIS — Z79899 Other long term (current) drug therapy: Secondary | ICD-10-CM | POA: Insufficient documentation

## 2013-02-27 HISTORY — DX: Gastro-esophageal reflux disease without esophagitis: K21.9

## 2013-02-27 HISTORY — DX: Pure hypercholesterolemia, unspecified: E78.00

## 2013-02-27 LAB — URINALYSIS, ROUTINE W REFLEX MICROSCOPIC
Bilirubin Urine: NEGATIVE
Nitrite: NEGATIVE
Protein, ur: NEGATIVE mg/dL
Urobilinogen, UA: 0.2 mg/dL (ref 0.0–1.0)

## 2013-02-27 LAB — URINE MICROSCOPIC-ADD ON

## 2013-02-27 MED ORDER — ALBUTEROL SULFATE HFA 108 (90 BASE) MCG/ACT IN AERS
2.0000 | INHALATION_SPRAY | RESPIRATORY_TRACT | Status: DC | PRN
  Administered 2013-02-27: 2 via RESPIRATORY_TRACT
  Filled 2013-02-27: qty 6.7

## 2013-02-27 NOTE — ED Notes (Signed)
Pt reports has had 4 days of cough.  Also requesting to be checked for uti, reports urinates normally but then 'drips' afterwards.  Denies dysuria.

## 2013-02-27 NOTE — ED Notes (Signed)
MD at bedside. 

## 2013-02-27 NOTE — Patient Instructions (Signed)
Instructed patient on the proper use of administering albuteral mdi via aerochamber patient tolerated well 

## 2013-02-27 NOTE — ED Notes (Signed)
Patient transported to X-ray ambulatory with tech. 

## 2013-02-27 NOTE — ED Provider Notes (Signed)
CSN: [id number]     Arrival date & time 02/27/13  8295 History   First MD Initiated Contact with Patient 02/27/13 0346     Chief Complaint  Patient presents with  . Cough   (Consider location/radiation/quality/duration/timing/severity/associated sxs/prior Treatment) HPI This is a 47 year old female with a five-day history of cough. She has obtained some relief with over-the-counter Robitussin but is now having some baseline shortness of breath not worsened with exertion. She has not had any associated fever, chills, nausea, vomiting or diarrhea. The cough is nonproductive. She is also concerned she may have a urinary tract infection because she feels an abnormality at the end of her stream. This abnormality is difficult for her to characterize but is like that associated with a previous urinary tract infection.  Past Medical History  Diagnosis Date  . Hypertension   . Thyroid disease   . Ulcer   . GERD (gastroesophageal reflux disease)   . Hypercholesteremia    Past Surgical History  Procedure Laterality Date  . Thyroid surgery     No family history on file. History  Substance Use Topics  . Smoking status: Light Tobacco Smoker    Types: Cigarettes  . Smokeless tobacco: Not on file  . Alcohol Use: Yes     Comment: occ   OB History   Grav Para Term Preterm Abortions TAB SAB Ect Mult Living                 Review of Systems  All other systems reviewed and are negative.    Allergies  Nitrofurantoin monohyd macro; Ciprofloxacin; and Penicillins  Home Medications   Current Outpatient Rx  Name  Route  Sig  Dispense  Refill  . rosuvastatin (CRESTOR) 10 MG tablet   Oral   Take 10 mg by mouth daily.         Marland Kitchen spironolactone-hydrochlorothiazide (ALDACTAZIDE) 25-25 MG per tablet   Oral   Take 1 tablet by mouth daily.         Marland Kitchen EXPIRED: amLODipine (NORVASC) 10 MG tablet   Oral   Take 1 tablet (10 mg total) by mouth daily.   7 tablet   0   . levothyroxine  (SYNTHROID, LEVOTHROID) 50 MCG tablet   Oral   Take 50 mcg by mouth daily.         Marland Kitchen omeprazole (PRILOSEC) 20 MG capsule   Oral   Take 20 mg by mouth 2 (two) times daily.          BP 124/83  Pulse 68  Temp(Src) 97.9 F (36.6 C) (Oral)  Resp 20  Ht 5' (1.524 m)  Wt 137 lb (62.143 kg)  BMI 26.76 kg/m2  SpO2 100%  LMP 02/22/2013  Physical Exam General: Well-developed, well-nourished female in no acute distress; appearance consistent with age of record HENT: normocephalic; atraumatic Eyes: pupils equal, round and reactive to light; extraocular muscles intact Neck: supple Heart: regular rate and rhythm; no murmurs, rubs or gallops Lungs: clear to auscultation bilaterally; frequent dry cough Abdomen: soft; nondistended; mild suprapubic and epigastric tenderness; no masses or hepatosplenomegaly; bowel sounds present Extremities: No deformity; full range of motion; pulses normal Neurologic: Awake, alert and oriented; motor function intact in all extremities and symmetric; no facial droop Skin: Warm and dry Psychiatric: Normal mood and affect    ED Course  Procedures (including critical care time)    MDM   Nursing notes and vitals signs, including pulse oximetry, reviewed.  Summary of this visit's results, reviewed  by myself:  Labs:  Results for orders placed during the hospital encounter of 02/27/13 (from the past 24 hour(s))  URINALYSIS, ROUTINE W REFLEX MICROSCOPIC     Status: Abnormal   Collection Time    02/27/13  3:47 AM      Result Value Range   Color, Urine YELLOW  YELLOW   APPearance CLEAR  CLEAR   Specific Gravity, Urine 1.020  1.005 - 1.030   pH 5.5  5.0 - 8.0   Glucose, UA NEGATIVE  NEGATIVE mg/dL   Hgb urine dipstick LARGE (*) NEGATIVE   Bilirubin Urine NEGATIVE  NEGATIVE   Ketones, ur NEGATIVE  NEGATIVE mg/dL   Protein, ur NEGATIVE  NEGATIVE mg/dL   Urobilinogen, UA 0.2  0.0 - 1.0 mg/dL   Nitrite NEGATIVE  NEGATIVE   Leukocytes, UA TRACE (*)  NEGATIVE  PREGNANCY, URINE     Status: None   Collection Time    02/27/13  3:47 AM      Result Value Range   Preg Test, Ur NEGATIVE  NEGATIVE  URINE MICROSCOPIC-ADD ON     Status: Abnormal   Collection Time    02/27/13  3:47 AM      Result Value Range   Squamous Epithelial / LPF FEW (*) RARE   WBC, UA 0-2  <3 WBC/hpf   RBC / HPF 3-6  <3 RBC/hpf   Bacteria, UA FEW (*) RARE   Urine-Other MUCOUS PRESENT      Imaging Studies: CXR: negative for acute changes per Dr. Grace Isaac.       Hanley Seamen, MD 02/27/13 [phone]

## 2013-05-09 ENCOUNTER — Emergency Department (HOSPITAL_BASED_OUTPATIENT_CLINIC_OR_DEPARTMENT_OTHER): Payer: 59

## 2013-05-09 ENCOUNTER — Encounter (HOSPITAL_BASED_OUTPATIENT_CLINIC_OR_DEPARTMENT_OTHER): Payer: Self-pay | Admitting: Emergency Medicine

## 2013-05-09 ENCOUNTER — Emergency Department (HOSPITAL_BASED_OUTPATIENT_CLINIC_OR_DEPARTMENT_OTHER)
Admission: EM | Admit: 2013-05-09 | Discharge: 2013-05-09 | Disposition: A | Discharge status: Home / Self Care | Payer: 59 | Attending: Emergency Medicine | Admitting: Emergency Medicine

## 2013-05-09 DIAGNOSIS — F17200 Nicotine dependence, unspecified, uncomplicated: Secondary | ICD-10-CM | POA: Insufficient documentation

## 2013-05-09 DIAGNOSIS — Z79899 Other long term (current) drug therapy: Secondary | ICD-10-CM | POA: Insufficient documentation

## 2013-05-09 DIAGNOSIS — R05 Cough: Secondary | ICD-10-CM

## 2013-05-09 DIAGNOSIS — E079 Disorder of thyroid, unspecified: Secondary | ICD-10-CM | POA: Insufficient documentation

## 2013-05-09 DIAGNOSIS — Z8719 Personal history of other diseases of the digestive system: Secondary | ICD-10-CM | POA: Insufficient documentation

## 2013-05-09 DIAGNOSIS — Z872 Personal history of diseases of the skin and subcutaneous tissue: Secondary | ICD-10-CM | POA: Insufficient documentation

## 2013-05-09 DIAGNOSIS — I10 Essential (primary) hypertension: Secondary | ICD-10-CM | POA: Insufficient documentation

## 2013-05-09 DIAGNOSIS — Z8709 Personal history of other diseases of the respiratory system: Secondary | ICD-10-CM | POA: Insufficient documentation

## 2013-05-09 DIAGNOSIS — E7800 Pure hypercholesterolemia, unspecified: Secondary | ICD-10-CM | POA: Insufficient documentation

## 2013-05-09 DIAGNOSIS — R059 Cough, unspecified: Secondary | ICD-10-CM

## 2013-05-09 MED ORDER — ALBUTEROL SULFATE HFA 108 (90 BASE) MCG/ACT IN AERS: 1.0000 | INHALATION_SPRAY | Freq: Four times a day (QID) | RESPIRATORY_TRACT | Status: DC | PRN

## 2013-05-09 NOTE — ED Provider Notes (Signed)
Medical screening examination/treatment/procedure(s) were performed by non-physician practitioner and as supervising physician I was immediately available for consultation/collaboration.  EKG Interpretation   None         Delice Bison Brendin Situ, DO 05/09/13 1534

## 2013-05-09 NOTE — Discharge Instructions (Signed)

## 2013-05-09 NOTE — ED Notes (Signed)
Treated for bronchitis x 1 month ago.  States "I think I have it again"  I don't really feel bad

## 2013-05-09 NOTE — ED Provider Notes (Signed)
CSN: [id number]     Arrival date & time 05/09/13  1314 History   First MD Initiated Contact with Patient 05/09/13 1323     Chief Complaint  Patient presents with  . Shortness of Breath     (Consider location/radiation/quality/duration/timing/severity/associated sxs/prior Treatment) HPI Comments: Pt states that she was diagnosed with bronchitis about 1 month ago and she feels like she has it again. Pt states that she doesn't feels bad but she intermittently feels sob and has a cough. Pt states that multiple people in her house have been sick and some people have pneumonia and she just wanted to be safe. Denies cp, fever  The history is provided by the patient. No language interpreter was used.    Past Medical History  Diagnosis Date  . Hypertension   . Thyroid disease   . Ulcer   . GERD (gastroesophageal reflux disease)   . Hypercholesteremia    Past Surgical History  Procedure Laterality Date  . Thyroid surgery     No family history on file. History  Substance Use Topics  . Smoking status: Light Tobacco Smoker    Types: Cigarettes  . Smokeless tobacco: Not on file  . Alcohol Use: Yes     Comment: occ   OB History   Grav Para Term Preterm Abortions TAB SAB Ect Mult Living                 Review of Systems  Constitutional: Negative.   Respiratory: Positive for cough and shortness of breath.   Cardiovascular: Negative.       Allergies  Nitrofurantoin monohyd macro; Ciprofloxacin; and Penicillins  Home Medications   Current Outpatient Rx  Name  Route  Sig  Dispense  Refill  . levothyroxine (SYNTHROID, LEVOTHROID) 50 MCG tablet   Oral   Take 50 mcg by mouth daily.         . rosuvastatin (CRESTOR) 10 MG tablet   Oral   Take 10 mg by mouth daily.         Marland Kitchen spironolactone-hydrochlorothiazide (ALDACTAZIDE) 25-25 MG per tablet   Oral   Take 1 tablet by mouth daily.          BP 120/71  Pulse 84  Temp(Src) 98.2 F (36.8 C) (Oral)  SpO2 100%  LMP  04/14/2013 Physical Exam  Nursing note and vitals reviewed. Constitutional: She appears well-developed and well-nourished.  HENT:  Right Ear: External ear normal.  Left Ear: External ear normal.  Mouth/Throat: Oropharynx is clear and moist.  Cardiovascular: Normal rate and regular rhythm.   Pulmonary/Chest: Effort normal and breath sounds normal. She exhibits no tenderness.  Musculoskeletal: Normal range of motion.  Neurological: She is alert.    ED Course  Procedures (including critical care time) Labs Review Labs Reviewed - No data to display Imaging Review Dg Chest 2 View  05/09/2013   CLINICAL DATA:  Sinus congestion and pressure, bronchitis 1 month ago  EXAM: CHEST  2 VIEW  COMPARISON:  DG CHEST 2 VIEW dated 02/27/2013  FINDINGS: The heart size and mediastinal contours are within normal limits. Both lungs are clear. The visualized skeletal structures are unremarkable.  IMPRESSION: No active cardiopulmonary disease.   Electronically Signed   By: Skipper Cliche M.D.   On: 05/09/2013 14:29    EKG Interpretation   None       MDM   Final diagnoses:  Cough    Pt states that the inhaler that was recently given to her has helped and  she would like another one. Think that is reasonable at this time    Glendell Docker, NP 05/09/13 1524

## 2016-04-29 ENCOUNTER — Encounter (HOSPITAL_BASED_OUTPATIENT_CLINIC_OR_DEPARTMENT_OTHER): Payer: Self-pay

## 2016-04-29 ENCOUNTER — Emergency Department (HOSPITAL_BASED_OUTPATIENT_CLINIC_OR_DEPARTMENT_OTHER): Payer: 59

## 2016-04-29 ENCOUNTER — Emergency Department (HOSPITAL_BASED_OUTPATIENT_CLINIC_OR_DEPARTMENT_OTHER)
Admission: EM | Admit: 2016-04-29 | Discharge: 2016-04-29 | Disposition: A | Discharge status: Home / Self Care | Payer: 59 | Attending: Emergency Medicine | Admitting: Emergency Medicine

## 2016-04-29 DIAGNOSIS — J069 Acute upper respiratory infection, unspecified: Secondary | ICD-10-CM | POA: Diagnosis not present

## 2016-04-29 DIAGNOSIS — M779 Enthesopathy, unspecified: Secondary | ICD-10-CM | POA: Insufficient documentation

## 2016-04-29 DIAGNOSIS — Z79899 Other long term (current) drug therapy: Secondary | ICD-10-CM | POA: Insufficient documentation

## 2016-04-29 DIAGNOSIS — I10 Essential (primary) hypertension: Secondary | ICD-10-CM | POA: Insufficient documentation

## 2016-04-29 DIAGNOSIS — M778 Other enthesopathies, not elsewhere classified: Secondary | ICD-10-CM

## 2016-04-29 DIAGNOSIS — F17210 Nicotine dependence, cigarettes, uncomplicated: Secondary | ICD-10-CM | POA: Insufficient documentation

## 2016-04-29 DIAGNOSIS — R002 Palpitations: Secondary | ICD-10-CM | POA: Diagnosis present

## 2016-04-29 LAB — CBC WITH DIFFERENTIAL/PLATELET
BASOS PCT: 1 %
Basophils Absolute: 0 10*3/uL (ref 0.0–0.1)
EOS ABS: 0.2 10*3/uL (ref 0.0–0.7)
Eosinophils Relative: 3 %
HCT: 39.7 % (ref 36.0–46.0)
Hemoglobin: 13.4 g/dL (ref 12.0–15.0)
Lymphocytes Relative: 37 %
Lymphs Abs: 2.3 10*3/uL (ref 0.7–4.0)
MCH: 29.5 pg (ref 26.0–34.0)
MCHC: 33.8 g/dL (ref 30.0–36.0)
MCV: 87.3 fL (ref 78.0–100.0)
Monocytes Absolute: 0.6 10*3/uL (ref 0.1–1.0)
Monocytes Relative: 9 %
NEUTROS PCT: 50 %
Neutro Abs: 3.2 10*3/uL (ref 1.7–7.7)
PLATELETS: 280 10*3/uL (ref 150–400)
RBC: 4.55 MIL/uL (ref 3.87–5.11)
RDW: 13.1 % (ref 11.5–15.5)
WBC: 6.3 10*3/uL (ref 4.0–10.5)

## 2016-04-29 LAB — BASIC METABOLIC PANEL
Anion gap: 8 (ref 5–15)
BUN: 19 mg/dL (ref 6–20)
CO2: 29 mmol/L (ref 22–32)
CREATININE: 0.96 mg/dL (ref 0.44–1.00)
Calcium: 9.4 mg/dL (ref 8.9–10.3)
Chloride: 99 mmol/L — ABNORMAL LOW (ref 101–111)
GFR calc Af Amer: >60 mL/min (ref >60)
Glucose, Bld: 145 mg/dL — ABNORMAL HIGH (ref 65–99)
POTASSIUM: 3.5 mmol/L (ref 3.5–5.1)
SODIUM: 136 mmol/L (ref 135–145)

## 2016-04-29 LAB — MAGNESIUM: MAGNESIUM: 2 mg/dL (ref 1.7–2.4)

## 2016-04-29 LAB — TSH: TSH: 1.466 u[IU]/mL (ref 0.350–4.500)

## 2016-04-29 LAB — I-STAT TROPONIN, ED: Troponin i, poc: 0 ng/mL (ref 0.00–0.08)

## 2016-04-29 LAB — PREGNANCY, URINE: Preg Test, Ur: NEGATIVE

## 2016-04-29 MED ORDER — IBUPROFEN 800 MG PO TABS: 800.0000 mg | ORAL_TABLET | Freq: Three times a day (TID) | ORAL | 0 refills | Status: DC | PRN

## 2016-04-29 MED ORDER — FLUTICASONE PROPIONATE 50 MCG/ACT NA SUSP: 2.0000 | Freq: Every day | NASAL | 0 refills | Status: DC

## 2016-04-29 MED ORDER — SODIUM CHLORIDE 0.9 % IV BOLUS (SEPSIS)
1000.0000 mL | Freq: Once | INTRAVENOUS | Status: AC
  Administered 2016-04-29: 1000 mL via INTRAVENOUS

## 2016-04-29 NOTE — ED Triage Notes (Signed)
Pt states that since yesterday she "has been feeling an irregular heart beat in my thyroid.  Pt points to her throat and clavicle region where she is having the symptoms.  She denies pain, denies SOB, denies dizziness and denies the feeling radiating to the back or arms.

## 2016-04-29 NOTE — ED Provider Notes (Signed)
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 States she feels a "thumping" in her throat. No chest pain or chest discomfort. No shortness of breath, nausea, vomiting. No recent diarrhea, bloody stools or melena. Denies caffeine intake at all. No illicit drug use or stimulant use. Feels that she has been eating and drinking well. Recently seen by her endocrinologist and states her thyroid function was normal.  Patient also complains of 3 weeks of dry cough and nasal congestion. No fevers or chills.  Cough is been nonproductive. States she was told she had bronchitis. Has not been using any medications over-the-counter other than Benadryl.    Also complaining of right thumb and index finger pain. 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 She is right-hand dominant. No injury to the right hand. No redness, warmth or swelling.  ROS: See HPI Constitutional: no fever  Eyes: no drainage  ENT:  runny nose   Cardiovascular:  no chest pain  Resp: no SOB  GI: no vomiting GU: no dysuria Integumentary: no rash  Allergy: no hives  Musculoskeletal: no leg swelling  Neurological: no slurred speech ROS otherwise negative  PAST MEDICAL HISTORY/PAST SURGICAL HISTORY:  Past Medical History:  Diagnosis Date  . GERD (gastroesophageal reflux disease)   . Hypercholesteremia   . Hypertension   . Thyroid disease   . Ulcer ([place])     MEDICATIONS:  Prior to Admission medications   Medication Sig Start Date End Date Taking? Authorizing Provider  levothyroxine (SYNTHROID, LEVOTHROID) 50 MCG tablet Take 88 mcg by mouth daily.    Yes Historical Provider, MD  rosuvastatin (CRESTOR) 10 MG tablet Take 10 mg by mouth  daily.   Yes Historical Provider, MD  spironolactone-hydrochlorothiazide (ALDACTAZIDE) 25-25 MG per tablet Take 1 tablet by mouth daily.   Yes Historical Provider, MD  albuterol (PROVENTIL HFA;VENTOLIN HFA) 108 (90 BASE) MCG/ACT inhaler Inhale 1-2 puffs into the lungs every 6 (six) hours as needed for wheezing or shortness of breath. 05/09/13   Glendell Docker, NP    ALLERGIES:  Allergies  Allergen Reactions  . Nitrofurantoin Monohyd Macro Shortness Of Breath    rash  . Ciprofloxacin   . Penicillins Rash    SOCIAL HISTORY:  Social History  Substance Use Topics  . Smoking status: Light Tobacco Smoker    Types: Cigarettes  . Smokeless tobacco: Not on file  . Alcohol use Yes     Comment: occ    FAMILY HISTORY: No family history on file.  EXAM: BP 151/89 (BP Location: Right Arm)   Pulse 78   Temp 97.7 F (36.5 C) (Oral)   Resp 18   Ht 5' (1.524 m)   Wt 150 lb (68 kg)   LMP 03/29/2016   SpO2 100%   BMI 29.29 kg/m  CONSTITUTIONAL: Alert and oriented and responds appropriately to questions. Well-appearing; well-nourished HEAD: Normocephalic EYES: Conjunctivae clear, PERRL, EOMI ENT: normal nose; no rhinorrhea; moist mucous membranes NECK: Supple, no meningismus, no nuchal rigidity, no LAD, no swelling noted, trachea is midline  CARD: RRR; S1 and S2 appreciated; no murmurs, no clicks, no rubs, no gallops RESP: Normal chest excursion without splinting or tachypnea; breath sounds clear and equal bilaterally; no wheezes, no rhonchi, no rales,  no hypoxia or respiratory distress, speaking full sentences ABD/GI: Normal bowel sounds; non-distended; soft, non-tender, no rebound, no guarding, no peritoneal signs, no hepatosplenomegaly BACK:  The back appears normal and is non-tender to palpation, there is no CVA tenderness EXT: Tender to palpation over the right thumb and index finger diffusely without swelling, erythema or warmth, fluctuance or induration, ecchymosis or deformity. No  erythema or warmth. 2+ radial pulse on the right side. No paronychia, no herpetic whitlow, no felon, no flexor tenosynovitis.  No joint effusion. Normal ROM in all joints; non-tender to palpation; no edema; normal capillary refill; no cyanosis, no calf tenderness or swelling    SKIN: Normal color for age and race; warm; no rash NEURO: Moves all extremities equally, sensation to light touch intact diffusely, cranial nerves II through XII intact, normal speech PSYCH: The patient's mood and manner are appropriate. Grooming and personal hygiene are appropriate.  MEDICAL DECISION MAKING: Patient here with multiple complaints. First patient is complaining of palpitations. No chest pain or shortness of breath. States she does not drink caffeine on denies illicit drug use. Will hydrate patient, check electrolytes, blood counts, troponin today. EKG shows no ischemic abnormality, arrhythmia or interval changes. Also complaining of runny nose and cough for the past 3 weeks. Will obtain chest x-ray but doubt pneumonia. Also complaining of right thumb pain but has no injury to this area and no bony tenderness to suggest fracture. Does report repetitive movements at work. Suspect that this is tendinitis. Have recommended ibuprofen. No signs of septic arthritis, paronychia, herpetic whitlow, felon, flexor tenosynovitis on exam.  ED PROGRESS: Patient's workup here has been unremarkable. Normal electrolytes, negative troponin, clear chest x-ray. Patient has not had any events on the cardiac monitor. I recommend close outpatient follow-up with her endocrinologist. TSH is pending at this time I do not feel there is thyrotoxicosis, myxedema coma.  Have recommended rest, elevation and ice for her right pain. I recommended Flonase and Afrin as needed for nasal congestion and Mucinex as needed for cough. I do not feel an spikes are indicated. Recommend increased water intake at home. Discussed return precautions. She is comfortable  with this plan.   At this time, I do not feel there is any life-threatening condition present. I have reviewed and discussed all results (EKG, imaging, lab, urine as appropriate) and exam findings with patient/family. I have reviewed nursing notes and appropriate previous records.  I feel the patient is safe to be discharged home without further emergent workup and can continue workup as an outpatient as needed. Discussed usual and customary return precautions. Patient/family verbalize understanding and are comfortable with this plan.  Outpatient follow-up has been provided. All questions have been answered.    EKG Interpretation  Date/Time:  Friday April 29 2016 02:20:32 EST Ventricular Rate:  82 PR Interval:    QRS Duration: 84 QT Interval:  383 QTC Calculation: 448 R Axis:   68 Text Interpretation:  Sinus rhythm Right atrial enlargement Baseline wander in lead(s) I III aVL No significant change since last tracing Confirmed by WARD,  DO, KRISTEN [phone]) on 04/29/2016 2:55:58 AM         [place], DO 04/29/16 [id number]

## 2016-04-29 NOTE — ED Notes (Signed)
Pt with uncomfortable sensation at site over thyroidectomy x 36 hours denies pain SHOB N/V dizziness or other sx states that she had a similar  episode in the past when her K+ was low.

## 2016-04-29 NOTE — Progress Notes (Signed)
I-stat troponin result given to Dr Leonides Schanz.

## 2016-04-29 NOTE — Discharge Instructions (Signed)
You may alternate between Tylenol 1000 mg every 6 hours as needed for fever and pain and ibuprofen 800 mg every 8 hours as needed for fever and pain. Please drink plenty of water and avoid caffeine, alcohol. You may use over-the-counter Afrin nasal spray to help with nasal congestion but do not use this medication for more than 3 days in a row. I recommend using nasal steroid daily. Do not need antibiotics at this time. Please follow-up with your primary care physician and cardiology if palpitations continue.

## 2016-07-20 ENCOUNTER — Emergency Department (HOSPITAL_BASED_OUTPATIENT_CLINIC_OR_DEPARTMENT_OTHER)
Admission: EM | Admit: 2016-07-20 | Discharge: 2016-07-20 | Disposition: A | Discharge status: Home / Self Care | Payer: 59 | Attending: Emergency Medicine | Admitting: Emergency Medicine

## 2016-07-20 ENCOUNTER — Emergency Department (HOSPITAL_BASED_OUTPATIENT_CLINIC_OR_DEPARTMENT_OTHER): Payer: 59

## 2016-07-20 ENCOUNTER — Encounter (HOSPITAL_BASED_OUTPATIENT_CLINIC_OR_DEPARTMENT_OTHER): Payer: Self-pay

## 2016-07-20 DIAGNOSIS — F17210 Nicotine dependence, cigarettes, uncomplicated: Secondary | ICD-10-CM | POA: Insufficient documentation

## 2016-07-20 DIAGNOSIS — M6740 Ganglion, unspecified site: Secondary | ICD-10-CM

## 2016-07-20 DIAGNOSIS — I10 Essential (primary) hypertension: Secondary | ICD-10-CM | POA: Diagnosis not present

## 2016-07-20 DIAGNOSIS — Z79899 Other long term (current) drug therapy: Secondary | ICD-10-CM | POA: Insufficient documentation

## 2016-07-20 DIAGNOSIS — M25531 Pain in right wrist: Secondary | ICD-10-CM | POA: Diagnosis present

## 2016-07-20 DIAGNOSIS — M779 Enthesopathy, unspecified: Secondary | ICD-10-CM

## 2016-07-20 DIAGNOSIS — M67431 Ganglion, right wrist: Secondary | ICD-10-CM | POA: Insufficient documentation

## 2016-07-20 MED ORDER — NAPROXEN 500 MG PO TABS: 500.0000 mg | ORAL_TABLET | Freq: Two times a day (BID) | ORAL | 0 refills | Status: DC

## 2016-07-20 NOTE — ED Provider Notes (Signed)
[place] DEPT [facility] Provider Note   CSN: [id number] Arrival date & time: 07/20/16  1545     History   Chief Complaint Chief Complaint  Patient presents with  . Wrist Pain    HPI Brooke Williamson is a 51 y.o. female.  HPI Patient presents to the emergency room with complaints of bilateral wrist pain although primarily on the right side. Patient states pain migrates up to her forearms. The pain increases with movement and activity. She is also noticed a soft bump on the radial side of her wrist.  The area is tender to the touch. She denies any fevers or chills. No recent injuries. Patient does a lot of activity with her hands and arms at work. Past Medical History:  Diagnosis Date  . GERD (gastroesophageal reflux disease)   . Hypercholesteremia   . Hypertension   . Thyroid disease   . Ulcer     There are no active problems to display for this patient.   Past Surgical History:  Procedure Laterality Date  . THYROID SURGERY      OB History    No data available       Home Medications    Prior to Admission medications   Medication Sig Start Date End Date Taking? Authorizing Provider  levothyroxine (SYNTHROID, LEVOTHROID) 50 MCG tablet Take 88 mcg by mouth daily.     [provider]  naproxen (NAPROSYN) 500 MG tablet Take 1 tablet (500 mg total) by mouth 2 (two) times daily. 07/20/16   Dorie Rank, MD  rosuvastatin (CRESTOR) 10 MG tablet Take 10 mg by mouth daily.    [provider]  spironolactone-hydrochlorothiazide (ALDACTAZIDE) 25-25 MG per tablet Take 1 tablet by mouth daily.    [provider]    Family History No family history on file.  Social History Social History  Substance Use Topics  . Smoking status: Current Every Day Smoker    Types: Cigarettes  . Smokeless tobacco: Never Used  . Alcohol use Yes     Comment: occ     Allergies   Nitrofurantoin monohyd macro; Ciprofloxacin; and Penicillins   Review of  Systems Review of Systems  All other systems reviewed and are negative.    Physical Exam Updated Vital Signs BP 127/85 (BP Location: Left Arm)   Pulse 87   Temp 98.6 F (37 C) (Oral)   Resp 16   Ht 5' (1.524 m)   Wt 68 kg   LMP 03/29/2016   SpO2 100%   BMI 29.29 kg/m   Physical Exam  Constitutional: She appears well-developed and well-nourished. No distress.  HENT:  Head: Normocephalic and atraumatic.  Right Ear: External ear normal.  Left Ear: External ear normal.  Eyes: Conjunctivae are normal. Right eye exhibits no discharge. Left eye exhibits no discharge. No scleral icterus.  Neck: Neck supple. No tracheal deviation present.  Cardiovascular: Normal rate.   Pulmonary/Chest: Effort normal. No stridor. No respiratory distress.  Abdominal: She exhibits no distension.  Musculoskeletal: She exhibits tenderness. She exhibits no edema.       Right wrist: She exhibits tenderness and bony tenderness. She exhibits normal range of motion, no swelling and no effusion.       Left wrist: Normal.  Small approximately less than 1 cm raised lesion underneath the surface skin along the neck to the wrist, rubbery echotexture, soft to the touch  Neurological: She is alert. Cranial nerve deficit: no gross deficits.  Skin: Skin is warm and dry. No rash  noted.  Psychiatric: She has a normal mood and affect.  Nursing note and vitals reviewed.    ED Treatments / Results    Radiology Dg Wrist Complete Right  Result Date: 07/20/2016 CLINICAL DATA:  Radial sided wrist pain for 1 week. EXAM: RIGHT WRIST - COMPLETE 3+ VIEW COMPARISON:  None. FINDINGS: There is no evidence of fracture or dislocation. There is no evidence of arthropathy or other focal bone abnormality. Soft tissues are unremarkable. IMPRESSION: Unremarkable radiographs of the right wrist. Electronically Signed   By: Jeb Levering M.D.   On: 07/20/2016 17:00    Procedures Procedures (including critical care  time)  Medications Ordered in ED Medications - No data to display   Initial Impression / Assessment and Plan / ED Course  I have reviewed the triage vital signs and the nursing notes.  Pertinent labs & imaging results that were available during my care of the patient were reviewed by me and considered in my medical decision making (see chart for details).   No signs of infection.  No trauma.  May be related to overuse, tendonitis.  Ganglion cyst also noted.  Will refer to sports medicine.  Final Clinical Impressions(s) / ED Diagnoses   Final diagnoses:  Tendonitis  Ganglion cyst    New Prescriptions New Prescriptions   NAPROXEN (NAPROSYN) 500 MG TABLET    Take 1 tablet (500 mg total) by mouth 2 (two) times daily.     Dorie Rank, MD 07/20/16 [id number]

## 2016-07-20 NOTE — ED Triage Notes (Signed)
c/o pain to bilat wrist x 1 week-"knot" to left wrist-denies injury-NAD-steady gait

## 2016-07-20 NOTE — Discharge Instructions (Signed)
Use the splints for comfort, take the medications as needed for pain, follow up with a sports medicine doctor for further evaluation

## 2016-11-19 ENCOUNTER — Emergency Department (HOSPITAL_BASED_OUTPATIENT_CLINIC_OR_DEPARTMENT_OTHER): Payer: Self-pay

## 2016-11-19 ENCOUNTER — Encounter (HOSPITAL_BASED_OUTPATIENT_CLINIC_OR_DEPARTMENT_OTHER): Payer: Self-pay | Admitting: Emergency Medicine

## 2016-11-19 ENCOUNTER — Emergency Department (HOSPITAL_BASED_OUTPATIENT_CLINIC_OR_DEPARTMENT_OTHER)
Admission: EM | Admit: 2016-11-19 | Discharge: 2016-11-19 | Disposition: A | Discharge status: Home / Self Care | Payer: Self-pay | Attending: Physician Assistant | Admitting: Physician Assistant

## 2016-11-19 DIAGNOSIS — I10 Essential (primary) hypertension: Secondary | ICD-10-CM | POA: Insufficient documentation

## 2016-11-19 DIAGNOSIS — D1801 Hemangioma of skin and subcutaneous tissue: Secondary | ICD-10-CM | POA: Insufficient documentation

## 2016-11-19 DIAGNOSIS — G5603 Carpal tunnel syndrome, bilateral upper limbs: Secondary | ICD-10-CM | POA: Insufficient documentation

## 2016-11-19 DIAGNOSIS — F17210 Nicotine dependence, cigarettes, uncomplicated: Secondary | ICD-10-CM | POA: Insufficient documentation

## 2016-11-19 DIAGNOSIS — D1800 Hemangioma unspecified site: Secondary | ICD-10-CM

## 2016-11-19 DIAGNOSIS — Z79899 Other long term (current) drug therapy: Secondary | ICD-10-CM | POA: Insufficient documentation

## 2016-11-19 MED ORDER — NAPROXEN 500 MG PO TABS: 500.0000 mg | ORAL_TABLET | Freq: Two times a day (BID) | ORAL | 0 refills | Status: DC

## 2016-11-19 NOTE — Discharge Instructions (Signed)
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 Please follow up with [facility] for both these findings and for your carpal tunnel.

## 2016-11-19 NOTE — ED Triage Notes (Signed)
Pt reports her thumbs are tingling and tender. Hx of carpal tunnel but never got the rx filled. She also reports a piece of glass in her left foot for over a month.

## 2016-11-19 NOTE — ED Notes (Signed)
Patient transported to X-ray 

## 2016-11-19 NOTE — ED Provider Notes (Signed)
[place] DEPT [facility] Provider Note   CSN: [id number] Arrival date & time: 11/19/16  1205     History   Chief Complaint Chief Complaint  Patient presents with  . Foreign Body in Skin  . Carpal Tunnel    HPI Chrisie Jankovich is a 51 y.o. female.  HPI   51 year old female presenting with multiple complaints. One complaint is that she never got her prescription for Naprosyn last time she was here. Patient still having numbness tingling in her radial distribution. We'll give her wrist splints and the same follow-up as last time.  In addition patient she stepped on a piece glass a month ago until has a little bit of pain on the bottom of her left foot. Plan to get imaging  Past Medical History:  Diagnosis Date  . GERD (gastroesophageal reflux disease)   . Hypercholesteremia   . Hypertension   . Thyroid disease   . Ulcer     There are no active problems to display for this patient.   Past Surgical History:  Procedure Laterality Date  . THYROID SURGERY      OB History    No data available       Home Medications    Prior to Admission medications   Medication Sig Start Date End Date Taking? Authorizing Provider  levothyroxine (SYNTHROID, LEVOTHROID) 50 MCG tablet Take 88 mcg by mouth daily.    Yes [provider]  rosuvastatin (CRESTOR) 10 MG tablet Take 10 mg by mouth daily.   Yes [provider]  spironolactone-hydrochlorothiazide (ALDACTAZIDE) 25-25 MG per tablet Take 1 tablet by mouth daily.   Yes [provider]  naproxen (NAPROSYN) 500 MG tablet Take 1 tablet (500 mg total) by mouth 2 (two) times daily. 07/20/16   Dorie Rank, MD    Family History No family history on file.  Social History Social History  Substance Use Topics  . Smoking status: Current Every Day Smoker    Types: Cigarettes  . Smokeless tobacco: Never Used  . Alcohol use Yes     Comment: occ     Allergies   Nitrofurantoin monohyd macro; Ciprofloxacin;  and Penicillins   Review of Systems Review of Systems  Constitutional: Negative for activity change and fatigue.  HENT: Negative for drooling.   Respiratory: Negative for cough.   Cardiovascular: Negative for chest pain.  Gastrointestinal: Negative for abdominal distention.  Genitourinary: Negative for dysuria.  Musculoskeletal: Negative for joint swelling.  Skin: Negative for rash.  Psychiatric/Behavioral: Negative for behavioral problems.  All other systems reviewed and are negative.    Physical Exam Updated Vital Signs BP 127/79 (BP Location: Left Arm)   Pulse 77   Temp 98.2 F (36.8 C) (Oral)   Resp 16   Ht 5' (1.524 m)   Wt 68 kg (150 lb)   LMP 03/29/2016   SpO2 99%   BMI 29.29 kg/m   Physical Exam  Constitutional: She is oriented to person, place, and time. She appears well-developed and well-nourished.  HENT:  Head: Normocephalic and atraumatic.  Eyes: Right eye exhibits no discharge.  Cardiovascular: Normal rate and regular rhythm.   Pulmonary/Chest: Effort normal and breath sounds normal.  Musculoskeletal:  Slight discolaration on the bottom of left foot, <3 mm  Normal strength and mvomemetn of both hands/wrists.  Neurological: She is oriented to person, place, and time.  Skin: Skin is warm and dry. She is not diaphoretic.  Psychiatric: She has a normal mood and affect.  Nursing note and  vitals reviewed.    ED Treatments / Results  Labs (all labs ordered are listed, but only abnormal results are displayed) Labs Reviewed - No data to display  EKG  EKG Interpretation None       Radiology No results found.  Procedures Procedures (including critical care time)  Medications Ordered in ED Medications - No data to display   Initial Impression / Assessment and Plan / ED Course  I have reviewed the triage vital signs and the nursing notes.  Pertinent labs & imaging results that were available during my care of the patient were reviewed by me  and considered in my medical decision making (see chart for details).     51 year old female presenting with multiple complaints. One complaint is that she never got her prescription for Naprosyn last time she was here. Patient still having numbness tingling in her radial distribution. We'll give her wrist splints and the same follow-up as last time.  In addition patient she stepped on a piece glass a month ago until has a little bit of pain on the bottom of her left foot. 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 We'll refer to orthopedics.   Final Clinical Impressions(s) / ED Diagnoses   Final diagnoses:  None    New Prescriptions New Prescriptions   No medications on file     Macarthur Critchley, MD 11/19/16 1402

## 2017-07-15 ENCOUNTER — Encounter (HOSPITAL_BASED_OUTPATIENT_CLINIC_OR_DEPARTMENT_OTHER): Payer: Self-pay | Admitting: Emergency Medicine

## 2017-07-15 ENCOUNTER — Other Ambulatory Visit: Payer: Self-pay

## 2017-07-15 ENCOUNTER — Emergency Department (HOSPITAL_BASED_OUTPATIENT_CLINIC_OR_DEPARTMENT_OTHER)
Admission: EM | Admit: 2017-07-15 | Discharge: 2017-07-15 | Disposition: A | Discharge status: Home / Self Care | Payer: Self-pay | Attending: Emergency Medicine | Admitting: Emergency Medicine

## 2017-07-15 DIAGNOSIS — I10 Essential (primary) hypertension: Secondary | ICD-10-CM | POA: Insufficient documentation

## 2017-07-15 DIAGNOSIS — R202 Paresthesia of skin: Secondary | ICD-10-CM | POA: Insufficient documentation

## 2017-07-15 DIAGNOSIS — M5410 Radiculopathy, site unspecified: Secondary | ICD-10-CM | POA: Insufficient documentation

## 2017-07-15 DIAGNOSIS — F17210 Nicotine dependence, cigarettes, uncomplicated: Secondary | ICD-10-CM | POA: Insufficient documentation

## 2017-07-15 DIAGNOSIS — Z79899 Other long term (current) drug therapy: Secondary | ICD-10-CM | POA: Insufficient documentation

## 2017-07-15 MED ORDER — GABAPENTIN 100 MG PO CAPS: 100.0000 mg | ORAL_CAPSULE | Freq: Three times a day (TID) | ORAL | 0 refills | Status: DC

## 2017-07-15 NOTE — ED Triage Notes (Signed)
Patient states that she has numbness to her left first finger x 1 month. Reports that she has carpal tunnel, and a pinched nerve. Reports that she was placed on meloxicam and that helps the pain, but does not help the numbness and "it is so annoying"

## 2017-07-15 NOTE — Discharge Instructions (Signed)
Continue taking the meloxicam as directed.  You can take the gabapentin for nerve pain.  As we discussed, it is very important for you to follow-up with referred orthopedic doctor for further evaluation of your carpal tunnel syndrome.  Call their office and arrange for an appointment.  Return to the emergency department for any worsening numbness, discoloration of her finger, coolness of your finger, worsening pain, fever or any other worsening or concerning symptoms.

## 2017-07-15 NOTE — ED Provider Notes (Signed)
[place] EMERGENCY DEPARTMENT Provider Note   CSN: [id number] Arrival date & time: 07/15/17  1010     History   Chief Complaint Chief Complaint  Patient presents with  . Numbness    HPI Brooke Williamson is a 52 y.o. female with PMH/o GERD, Thyroid disease, Carpal Tunnel Syndrome who presents for evaluation of left index finger numbness that has been ongoing for 1 month.  Patient states that she can still feel but states that she has a tingling sensation in the left index finger that begins at the mid finger and extends distally.  Patient states that the symptoms have been going on for approximately 1 month and states that there is no new or changes in symptoms that brought her to the emergency department today.  Patient states that she came because "the symptoms are getting annoying."  Patient reports that she has a history of carpal tunnel syndrome to bilateral wrist.  Patient reports that she had been giving wrist splints and had been wearing them with improvement in her symptoms.  Patient recently started experiencing some similar pain in her left wrist and was seen by a adult health clinic.  Patient was prescribed some meloxicam at that time.  Patient states she never followed up with orthopedics.  Patient denies any preceding trauma, injury, fall to symptoms.  Patient states she has been able to function normally without any difficulty and denies any decreased grip strength.  Patient reports occasionally she will sometimes have some sharp shooting pain that extends from her neck down into her arm.  Patient denies any fevers, history of IV drug use, saddle anesthesia, urinary or bowel incontinence, history of back surgery, speech difficulty, vision changes.   The history is provided by the patient.    Past Medical History:  Diagnosis Date  . GERD (gastroesophageal reflux disease)   . Hypercholesteremia   . Hypertension   . Thyroid disease   . Ulcer     There are no  active problems to display for this patient.   Past Surgical History:  Procedure Laterality Date  . THYROID SURGERY       OB History   None      Home Medications    Prior to Admission medications   Medication Sig Start Date End Date Taking? Authorizing Provider  gabapentin (NEURONTIN) 100 MG capsule Take 1 capsule (100 mg total) by mouth 3 (three) times daily for 5 days. 07/15/17 07/20/17  Volanda Napoleon, PA-C  levothyroxine (SYNTHROID, LEVOTHROID) 50 MCG tablet Take 88 mcg by mouth daily.     [provider]  naproxen (NAPROSYN) 500 MG tablet Take 1 tablet (500 mg total) by mouth 2 (two) times daily. 07/20/16   Dorie Rank, MD  naproxen (NAPROSYN) 500 MG tablet Take 1 tablet (500 mg total) by mouth 2 (two) times daily. 11/19/16   Mackuen, Courteney Lyn, MD  rosuvastatin (CRESTOR) 10 MG tablet Take 10 mg by mouth daily.    [provider]  spironolactone-hydrochlorothiazide (ALDACTAZIDE) 25-25 MG per tablet Take 1 tablet by mouth daily.    [provider]    Family History History reviewed. No pertinent family history.  Social History Social History   Tobacco Use  . Smoking status: Current Every Day Smoker    Types: Cigarettes  . Smokeless tobacco: Never Used  Substance Use Topics  . Alcohol use: Yes    Comment: occ  . Drug use: No     Allergies   Nitrofurantoin monohyd macro;  Ciprofloxacin; and Penicillins   Review of Systems Review of Systems  Constitutional: Negative for fever.  Eyes: Negative for visual disturbance.  Musculoskeletal: Negative for back pain and neck pain.  Neurological: Positive for numbness (left index). Negative for weakness.     Physical Exam Updated Vital Signs BP (!) 135/98 (BP Location: Right Arm)   Pulse 86   Temp 98.3 F (36.8 C) (Oral)   Resp 18   Ht 5' (1.524 m)   Wt 64.9 kg (143 lb)   LMP 03/29/2016   SpO2 100%   BMI 27.93 kg/m   Physical Exam  Constitutional: She is oriented to person, place,  and time. She appears well-developed and well-nourished.  HENT:  Head: Normocephalic and atraumatic.  Mouth/Throat: Oropharynx is clear and moist and mucous membranes are normal.  Eyes: Pupils are equal, round, and reactive to light. Conjunctivae, EOM and lids are normal. Right eye exhibits no discharge. Left eye exhibits no discharge. No scleral icterus.  Neck: Full passive range of motion without pain.  Full flexion/extension and lateral movement of neck fully intact. No bony midline tenderness. No deformities or crepitus.  Positive Spurlings maneuver.   Cardiovascular: Normal pulses.  Pulses:      Radial pulses are 2+ on the right side, and 2+ on the left side.  Pulmonary/Chest: Effort normal.  Musculoskeletal: Normal range of motion.  Full range of motion of left wrist without any difficulty. Patient is easily able to make a fist without any difficulty.  Full flexion/extension of all 5 digits intact without difficulty. No deformity or crepitus of left index finger.  Full range of motion of left elbow, left shoulder.  Positive Phalen's on left side. No abnormalities of the right upper extremity.  Neurological: She is alert and oriented to person, place, and time.  Cranial nerves III-XII intact Follows commands, Moves all extremities  5/5 strength to BUE and BLE  Subjective decreased sensation noted to the left index finger that began just proximal to the PIP and extends distally.  Otherwise sensation intact throughout all major nerve distributions.  Patient is able to distinguish sharp versus dull sensation on all major dermatomes of the bilateral upper extremities, including the left index finger. Normal finger to nose. No dysdiadochokinesia. No pronator drift. No gait abnormalities  No slurred speech. No facial droop.   Skin: Skin is warm and dry. Capillary refill takes less than 2 seconds.  Good distal cap refill.  Left Index finger  is not dusky in appearance or cool to touch.    Psychiatric: She has a normal mood and affect. Her speech is normal and behavior is normal.  Nursing note and vitals reviewed.    ED Treatments / Results  Labs (all labs ordered are listed, but only abnormal results are displayed) Labs Reviewed - No data to display  EKG None  Radiology No results found.  Procedures Procedures (including critical care time)  Medications Ordered in ED Medications - No data to display   Initial Impression / Assessment and Plan / ED Course  I have reviewed the triage vital signs and the nursing notes.  Pertinent labs & imaging results that were available during my care of the patient were reviewed by me and considered in my medical decision making (see chart for details).     52 year old female who presents for evaluation of left index finger numbness.  This is been ongoing for approximately 1 month.  Patient has a history of carpal tunnel syndrome and states she has  been taking meloxicam which has been helping with the pain.  Patient still having some numbness to the left index finger.  She states she still will feel but describes a tingling sensation.  Patient also states she will occasionally have some shooting pain down her left upper extremity from her neck.  No vision changes, coldness, difficulty speaking, decreased strength. Patient is afebrile, non-toxic appearing, sitting comfortably on examination table. Vital signs reviewed and stable. Patient is neurovascularly intact.  On exam, patient reports decreased sensation to the left index finger that begins just proximal to the PIP joint and extends distally.  Although on neuro exam, patient is easily able to distinguish sharp versus dull on all major dermatome distributions of the bilateral upper extremities, including the left index finger.  Left index finger has good cap refill and does not appear dusky in appearance or cool to touch.  Consider radiculopathy versus carpal tunnel pain/numbness.   History/physical exam is not concerning for acute arterial embolism, septic arthritis, CVA.  Additionally, there is no signs on physical exam that would concerning for emboli to the finger.  Suspect this may be related to patient's carpal tunnel she is still having pain and has a positive Phalen's test today.  She has not been followed up for orthopedic.  We will plan to place her in a splint for support and stabilization.  We will plan to send home a short course of gabapentin.  Encourage patient to follow-up with outpatient orthopedic doctor for further evaluation and treatment of her symptoms.  There are no signs of acute emboli in the finger here on exam today.  I discussed with patient extensively regarding return precautions.  Instructed her to follow-up with ortho as directed. Patient had ample opportunity for questions and discussion. All patient's questions were answered with full understanding. Strict return precautions discussed. Patient expresses understanding and agreement to plan.   Final Clinical Impressions(s) / ED Diagnoses   Final diagnoses:  Radiculopathy, unspecified spinal region  Paresthesia    ED Discharge Orders        Ordered    gabapentin (NEURONTIN) 100 MG capsule  3 times daily     07/15/17 1043       Desma Mcgregor 07/15/17 1054    Blanchie Dessert, MD 07/15/17 2023

## 2017-09-17 ENCOUNTER — Other Ambulatory Visit: Payer: Self-pay

## 2017-09-17 ENCOUNTER — Encounter (HOSPITAL_BASED_OUTPATIENT_CLINIC_OR_DEPARTMENT_OTHER): Payer: Self-pay | Admitting: Emergency Medicine

## 2017-09-17 ENCOUNTER — Emergency Department (HOSPITAL_BASED_OUTPATIENT_CLINIC_OR_DEPARTMENT_OTHER)
Admission: EM | Admit: 2017-09-17 | Discharge: 2017-09-17 | Disposition: A | Discharge status: Home / Self Care | Payer: Self-pay | Attending: Emergency Medicine | Admitting: Emergency Medicine

## 2017-09-17 DIAGNOSIS — I10 Essential (primary) hypertension: Secondary | ICD-10-CM | POA: Insufficient documentation

## 2017-09-17 DIAGNOSIS — K0889 Other specified disorders of teeth and supporting structures: Secondary | ICD-10-CM | POA: Insufficient documentation

## 2017-09-17 DIAGNOSIS — Z79899 Other long term (current) drug therapy: Secondary | ICD-10-CM | POA: Insufficient documentation

## 2017-09-17 DIAGNOSIS — F17210 Nicotine dependence, cigarettes, uncomplicated: Secondary | ICD-10-CM | POA: Insufficient documentation

## 2017-09-17 MED ORDER — CLINDAMYCIN HCL 150 MG PO CAPS
450.0000 mg | ORAL_CAPSULE | Freq: Once | ORAL | Status: AC
  Administered 2017-09-17: 450 mg via ORAL
  Filled 2017-09-17: qty 3

## 2017-09-17 MED ORDER — NAPROXEN 250 MG PO TABS
500.0000 mg | ORAL_TABLET | Freq: Once | ORAL | Status: AC
  Administered 2017-09-17: 500 mg via ORAL
  Filled 2017-09-17: qty 2

## 2017-09-17 MED ORDER — NAPROXEN 500 MG PO TABS: 500.0000 mg | ORAL_TABLET | Freq: Two times a day (BID) | ORAL | 0 refills | Status: DC

## 2017-09-17 MED ORDER — PROBIOTIC 250 MG PO CAPS: 1.0000 | ORAL_CAPSULE | Freq: Every day | ORAL | 0 refills | Status: DC

## 2017-09-17 MED ORDER — CLINDAMYCIN HCL 150 MG PO CAPS: 450.0000 mg | ORAL_CAPSULE | Freq: Three times a day (TID) | ORAL | 0 refills | Status: AC

## 2017-09-17 NOTE — ED Provider Notes (Signed)
[facility] EMERG[facility] DEPARTMENT Provider Note   CSN: [id number] Arrival date & time: 09/17/17  1122     History   Chief Complaint Chief Complaint  Patient presents with  . Dental Pain    HPI Brooke Williamson is a 52 y.o. female with history of hypertension, hypercholesterolemia, GERD who presents with a 1 day history of dental pain.  Patient has had problems with her right lower molars in the past and does know she needs to get them out.  She reports subjective fevers yesterday.  She has had some pain radiating to her ear.  She has been taking Tylenol at home with minimal relief.  She denies any chest pain, shortness of breath, abdominal pain, nausea, vomiting.  HPI  Past Medical History:  Diagnosis Date  . GERD (gastroesophageal reflux disease)   . Hypercholesteremia   . Hypertension   . Thyroid disease   . Ulcer     There are no active problems to display for this patient.   Past Surgical History:  Procedure Laterality Date  . THYROID SURGERY       OB History   None      Home Medications    Prior to Admission medications   Medication Sig Start Date End Date Taking? Authorizing Provider  clindamycin (CLEOCIN) 150 MG capsule Take 3 capsules (450 mg total) by mouth 3 (three) times daily for 7 days. 09/17/17 09/24/17  Frederica Kuster, PA-C  gabapentin (NEURONTIN) 100 MG capsule Take 1 capsule (100 mg total) by mouth 3 (three) times daily for 5 days. 07/15/17 07/20/17  Volanda Napoleon, PA-C  levothyroxine (SYNTHROID, LEVOTHROID) 50 MCG tablet Take 88 mcg by mouth daily.     [provider]  naproxen (NAPROSYN) 500 MG tablet Take 1 tablet (500 mg total) by mouth 2 (two) times daily. 09/17/17   Amyra Vantuyl, Bea Graff, PA-C  rosuvastatin (CRESTOR) 10 MG tablet Take 10 mg by mouth daily.    [provider]  Saccharomyces boulardii (PROBIOTIC) 250 MG CAPS Take 1 capsule by mouth daily. 09/17/17   Luay Balding, Bea Graff, PA-C  spironolactone-hydrochlorothiazide  (ALDACTAZIDE) 25-25 MG per tablet Take 1 tablet by mouth daily.    [provider]    Family History History reviewed. No pertinent family history.  Social History Social History   Tobacco Use  . Smoking status: Current Every Day Smoker    Types: Cigarettes  . Smokeless tobacco: Never Used  Substance Use Topics  . Alcohol use: Yes    Comment: occ  . Drug use: No     Allergies   Nitrofurantoin monohyd macro; Ciprofloxacin; and Penicillins   Review of Systems Review of Systems  Constitutional: Positive for fever (subjective).  HENT: Positive for dental problem and ear pain.   Respiratory: Negative for shortness of breath.   Cardiovascular: Negative for chest pain.  Gastrointestinal: Negative for abdominal pain, nausea and vomiting.     Physical Exam Updated Vital Signs BP (!) 143/86 (BP Location: Left Arm)   Pulse 90   Temp 98.9 F (37.2 C) (Oral)   Resp 16   Ht 5' (1.524 m)   Wt 63.6 kg (140 lb 4.8 oz)   LMP 03/29/2016   SpO2 100%   BMI 27.40 kg/m   Physical Exam  Constitutional: She appears well-developed and well-nourished. No distress.  HENT:  Head: Normocephalic and atraumatic.  Right Ear: Tympanic membrane normal.  Left Ear: Tympanic membrane normal.  Mouth/Throat: Oropharynx is clear and moist. No oropharyngeal exudate.  No submandibular tenderness or masses  Eyes: Pupils are equal, round, and reactive to light. Conjunctivae are normal. Right eye exhibits no discharge. Left eye exhibits no discharge. No scleral icterus.  Neck: Normal range of motion. Neck supple. No thyromegaly present.  Cardiovascular: Normal rate, regular rhythm, normal heart sounds and intact distal pulses. Exam reveals no gallop and no friction rub.  No murmur heard. Pulmonary/Chest: Effort normal and breath sounds normal. No stridor. No respiratory distress. She has no wheezes. She has no rales.  Abdominal: Soft. Bowel sounds are normal. She exhibits no distension.  There is no tenderness. There is no rebound and no guarding.  Musculoskeletal: She exhibits no edema.  Lymphadenopathy:    She has no cervical adenopathy.  Neurological: She is alert. Coordination normal.  Skin: Skin is warm and dry. No rash noted. She is not diaphoretic. No pallor.  Psychiatric: She has a normal mood and affect.  Nursing note and vitals reviewed.    ED Treatments / Results  Labs (all labs ordered are listed, but only abnormal results are displayed) Labs Reviewed - No data to display  EKG None  Radiology No results found.  Procedures Procedures (including critical care time)  Medications Ordered in ED Medications  clindamycin (CLEOCIN) capsule 450 mg (has no administration in time range)  naproxen (NAPROSYN) tablet 500 mg (has no administration in time range)     Initial Impression / Assessment and Plan / ED Course  I have reviewed the triage vital signs and the nursing notes.  Pertinent labs & imaging results that were available during my care of the patient were reviewed by me and considered in my medical decision making (see chart for details).     Patient with dentalgia.  No abscess requiring immediate incision and drainage, however suspect developing abscess.  Exam not concerning for Ludwig's angina or pharyngeal abscess.  Will treat with clindamycin, probiotic, Naprosyn, Tylenol. Pt instructed to follow-up with dentist.  Patient plans to call tomorrow.  Discussed return precautions.  Patient understands and agrees with plan.  Patient vitals stable throughout ED course and discharged in satisfactory condition.  Final Clinical Impressions(s) / ED Diagnoses   Final diagnoses:  Pain, dental    ED Discharge Orders        Ordered    naproxen (NAPROSYN) 500 MG tablet  2 times daily     09/17/17 1245    clindamycin (CLEOCIN) 150 MG capsule  3 times daily     09/17/17 1245    Saccharomyces boulardii (PROBIOTIC) 250 MG CAPS  Daily     09/17/17 1245         Frederica Kuster, PA-C 09/17/17 1250    Davonna Belling, MD 09/17/17 1535

## 2017-09-17 NOTE — Discharge Instructions (Signed)
Take clindamycin as prescribed.  Recommend taking this with a probiotic which she can get at the pharmacy to help prevent stomach upset.  Continue taking Tylenol as needed for your pain.  You can additionally take Naprosyn twice daily.  Please call your dentist tomorrow and make an appointment as soon as possible for further management of your dental pain.  Please return the emergency department if you develop any new or worsening symptoms.

## 2017-09-17 NOTE — ED Triage Notes (Signed)
Patient states that she has pain to her right lower jaw  Since yesterday

## 2017-12-01 ENCOUNTER — Other Ambulatory Visit: Payer: Self-pay

## 2017-12-01 ENCOUNTER — Emergency Department (HOSPITAL_BASED_OUTPATIENT_CLINIC_OR_DEPARTMENT_OTHER)
Admission: EM | Admit: 2017-12-01 | Discharge: 2017-12-01 | Disposition: A | Discharge status: Home / Self Care | Payer: Self-pay | Attending: Emergency Medicine | Admitting: Emergency Medicine

## 2017-12-01 ENCOUNTER — Encounter (HOSPITAL_BASED_OUTPATIENT_CLINIC_OR_DEPARTMENT_OTHER): Payer: Self-pay | Admitting: Emergency Medicine

## 2017-12-01 DIAGNOSIS — I10 Essential (primary) hypertension: Secondary | ICD-10-CM | POA: Insufficient documentation

## 2017-12-01 DIAGNOSIS — M62830 Muscle spasm of back: Secondary | ICD-10-CM | POA: Insufficient documentation

## 2017-12-01 DIAGNOSIS — M545 Low back pain: Secondary | ICD-10-CM

## 2017-12-01 DIAGNOSIS — T148XXA Other injury of unspecified body region, initial encounter: Secondary | ICD-10-CM

## 2017-12-01 DIAGNOSIS — F17210 Nicotine dependence, cigarettes, uncomplicated: Secondary | ICD-10-CM | POA: Insufficient documentation

## 2017-12-01 DIAGNOSIS — Z79899 Other long term (current) drug therapy: Secondary | ICD-10-CM | POA: Insufficient documentation

## 2017-12-01 MED ORDER — CYCLOBENZAPRINE HCL 10 MG PO TABS: 10.0000 mg | ORAL_TABLET | Freq: Two times a day (BID) | ORAL | 0 refills | Status: DC | PRN

## 2017-12-01 MED ORDER — KETOROLAC TROMETHAMINE 15 MG/ML IJ SOLN
15.0000 mg | Freq: Once | INTRAMUSCULAR | Status: AC
  Administered 2017-12-01: 15 mg via INTRAMUSCULAR
  Filled 2017-12-01: qty 1

## 2017-12-01 MED ORDER — NAPROXEN 500 MG PO TABS: 500.0000 mg | ORAL_TABLET | Freq: Two times a day (BID) | ORAL | 0 refills | Status: AC

## 2017-12-01 NOTE — ED Notes (Signed)
ED Provider at bedside. 

## 2017-12-01 NOTE — ED Provider Notes (Signed)
[facility] EM[facility] DEPARTMENT Provider Note   CSN: [id number] Arrival date & time: 12/01/17  6546     History   Chief Complaint Chief Complaint  Patient presents with  . Back Pain    HPI Brooke Williamson is a 52 y.o. female.  The history is provided by the patient.  Back Pain   This is a new problem. The problem occurs daily. The problem has not changed since onset.The pain is associated with no known injury. The pain is present in the lumbar spine. The quality of the pain is described as shooting. The pain radiates to the right thigh. The pain is at a severity of 5/10. The pain is moderate. The symptoms are aggravated by certain positions. Pertinent negatives include no chest pain, no fever, no numbness, no weight loss, no headaches, no abdominal pain, no abdominal swelling, no bowel incontinence, no perianal numbness, no bladder incontinence, no dysuria, no pelvic pain, no leg pain, no paresthesias, no paresis, no tingling and no weakness. She has tried NSAIDs for the symptoms. The treatment provided mild relief.    Past Medical History:  Diagnosis Date  . GERD (gastroesophageal reflux disease)   . Hypercholesteremia   . Hypertension   . Thyroid disease   . Ulcer     There are no active problems to display for this patient.   Past Surgical History:  Procedure Laterality Date  . THYROID SURGERY       OB History   None      Home Medications    Prior to Admission medications   Medication Sig Start Date End Date Taking? Authorizing Provider  levothyroxine (SYNTHROID, LEVOTHROID) 88 MCG tablet  11/22/17  Yes [provider]  spironolactone-hydrochlorothiazide (ALDACTAZIDE) 25-25 MG per tablet Take 1 tablet by mouth daily.   Yes [provider]  cyclobenzaprine (FLEXERIL) 10 MG tablet Take 1 tablet (10 mg total) by mouth 2 (two) times daily as needed for up to 12 doses for muscle spasms. 12/01/17   Gust Eugene, DO  naproxen  (NAPROSYN) 500 MG tablet Take 1 tablet (500 mg total) by mouth 2 (two) times daily for 30 doses. 12/01/17 12/16/17  Monika Chestang, DO  Saccharomyces boulardii (PROBIOTIC) 250 MG CAPS Take 1 capsule by mouth daily. 09/17/17   Frederica Kuster, PA-C    Family History No family history on file.  Social History Social History   Tobacco Use  . Smoking status: Current Every Day Smoker    Types: Cigarettes  . Smokeless tobacco: Never Used  Substance Use Topics  . Alcohol use: Yes    Comment: occ  . Drug use: No     Allergies   Nitrofurantoin monohyd macro; Ciprofloxacin; and Penicillins   Review of Systems Review of Systems  Constitutional: Negative for chills, fever and weight loss.  HENT: Negative for ear pain and sore throat.   Eyes: Negative for pain and visual disturbance.  Respiratory: Negative for cough and shortness of breath.   Cardiovascular: Negative for chest pain and palpitations.  Gastrointestinal: Negative for abdominal pain, bowel incontinence and vomiting.  Genitourinary: Negative for bladder incontinence, dysuria, hematuria and pelvic pain.  Musculoskeletal: Positive for back pain. Negative for arthralgias, gait problem, joint swelling, neck pain and neck stiffness.  Skin: Negative for color change and rash.  Neurological: Negative for tingling, seizures, syncope, weakness, numbness, headaches and paresthesias.  All other systems reviewed and are negative.    Physical Exam Updated Vital Signs  ED Triage Vitals  Enc  Vitals Group     BP 12/01/17 0922 118/70     Pulse Rate 12/01/17 0922 86     Resp 12/01/17 0922 16     Temp 12/01/17 0922 98.9 F (37.2 C)     Temp Source 12/01/17 0922 Oral     SpO2 12/01/17 0922 100 %     Weight 12/01/17 0922 135 lb (61.2 kg)     Height 12/01/17 0922 5' (1.524 m)     Head Circumference --      Peak Flow --      Pain Score 12/01/17 0921 7     Pain Loc --      Pain Edu? --      Excl. in [place]? --     Physical Exam    Constitutional: She is oriented to person, place, and time. She appears well-developed and well-nourished. No distress.  HENT:  Head: Normocephalic and atraumatic.  Eyes: Pupils are equal, round, and reactive to light. Conjunctivae and EOM are normal.  Neck: Normal range of motion. Neck supple.  Cardiovascular: Normal rate and regular rhythm.  No murmur heard. Pulmonary/Chest: Effort normal and breath sounds normal. No respiratory distress.  Abdominal: Soft. There is no tenderness.  Musculoskeletal: Normal range of motion. She exhibits tenderness (TTP to paraspinal lumbar muscles on right, no midline tenderness). She exhibits no edema.  Neurological: She is alert and oriented to person, place, and time. No cranial nerve deficit or sensory deficit. She exhibits normal muscle tone. Coordination normal.  5+/5 strength and normal sensation throughout, normal gait  Skin: Skin is warm and dry.  Psychiatric: She has a normal mood and affect.  Nursing note and vitals reviewed.    ED Treatments / Results  Labs (all labs ordered are listed, but only abnormal results are displayed) Labs Reviewed - No data to display  EKG None  Radiology No results found.  Procedures Procedures (including critical care time)  Medications Ordered in ED Medications  ketorolac (TORADOL) 15 MG/ML injection 15 mg (has no administration in time range)     Initial Impression / Assessment and Plan / ED Course  I have reviewed the triage vital signs and the nursing notes.  Pertinent labs & imaging results that were available during my care of the patient were reviewed by me and considered in my medical decision making (see chart for details).     Brooke Williamson is a 52 year old female with no significant medical history who presents to the ED with right-sided back pain.  Patient with normal vitals.  No fever.  No history of trauma.  No red flag signs including no loss of bowel or bladder, no IV drug  use, no fever.  No concern for cauda equina.  Patient is tender in the right sided paraspinal muscles.  Consistent with muscle spasm.  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  Had this happen to her about 3 years ago.  Has not taken anything yet medication wise.  Patient is neurologically intact.  Fairly normal gait.  No concern for cord process.  No midline spinal tenderness.  Given Toradol in the ED IM.  Given prescription for Naprosyn and Flexeril.  Recommend follow-up with primary care doctor and discharged from ED in good condition.  Given information about back exercises.  This chart was dictated using voice recognition software.  Despite best efforts to proofread,  errors can occur which can change the documentation meaning.   Final Clinical Impressions(s) /  ED Diagnoses   Final diagnoses:  Acute right-sided low back pain, with sciatica presence unspecified  Muscle strain  Muscle spasm of back    ED Discharge Orders         Ordered    naproxen (NAPROSYN) 500 MG tablet  2 times daily     12/01/17 0936    cyclobenzaprine (FLEXERIL) 10 MG tablet  2 times daily PRN     12/01/17 0936           Lennice Sites, DO 12/01/17 [phone]

## 2017-12-01 NOTE — ED Triage Notes (Signed)
Right sided lower back pain since Monday.  Pain is "shooting down the back of my thigh"

## 2018-03-09 IMAGING — DX DG CHEST 2V
2 series · 2 of 2 positions shown · non-contrast
Comparison: Chest radiograph May 09, 2013

CLINICAL DATA: Palpitations and cough, recent bronchitis.  Smoker.

EXAM:
CHEST  2 VIEW

[chest pa]
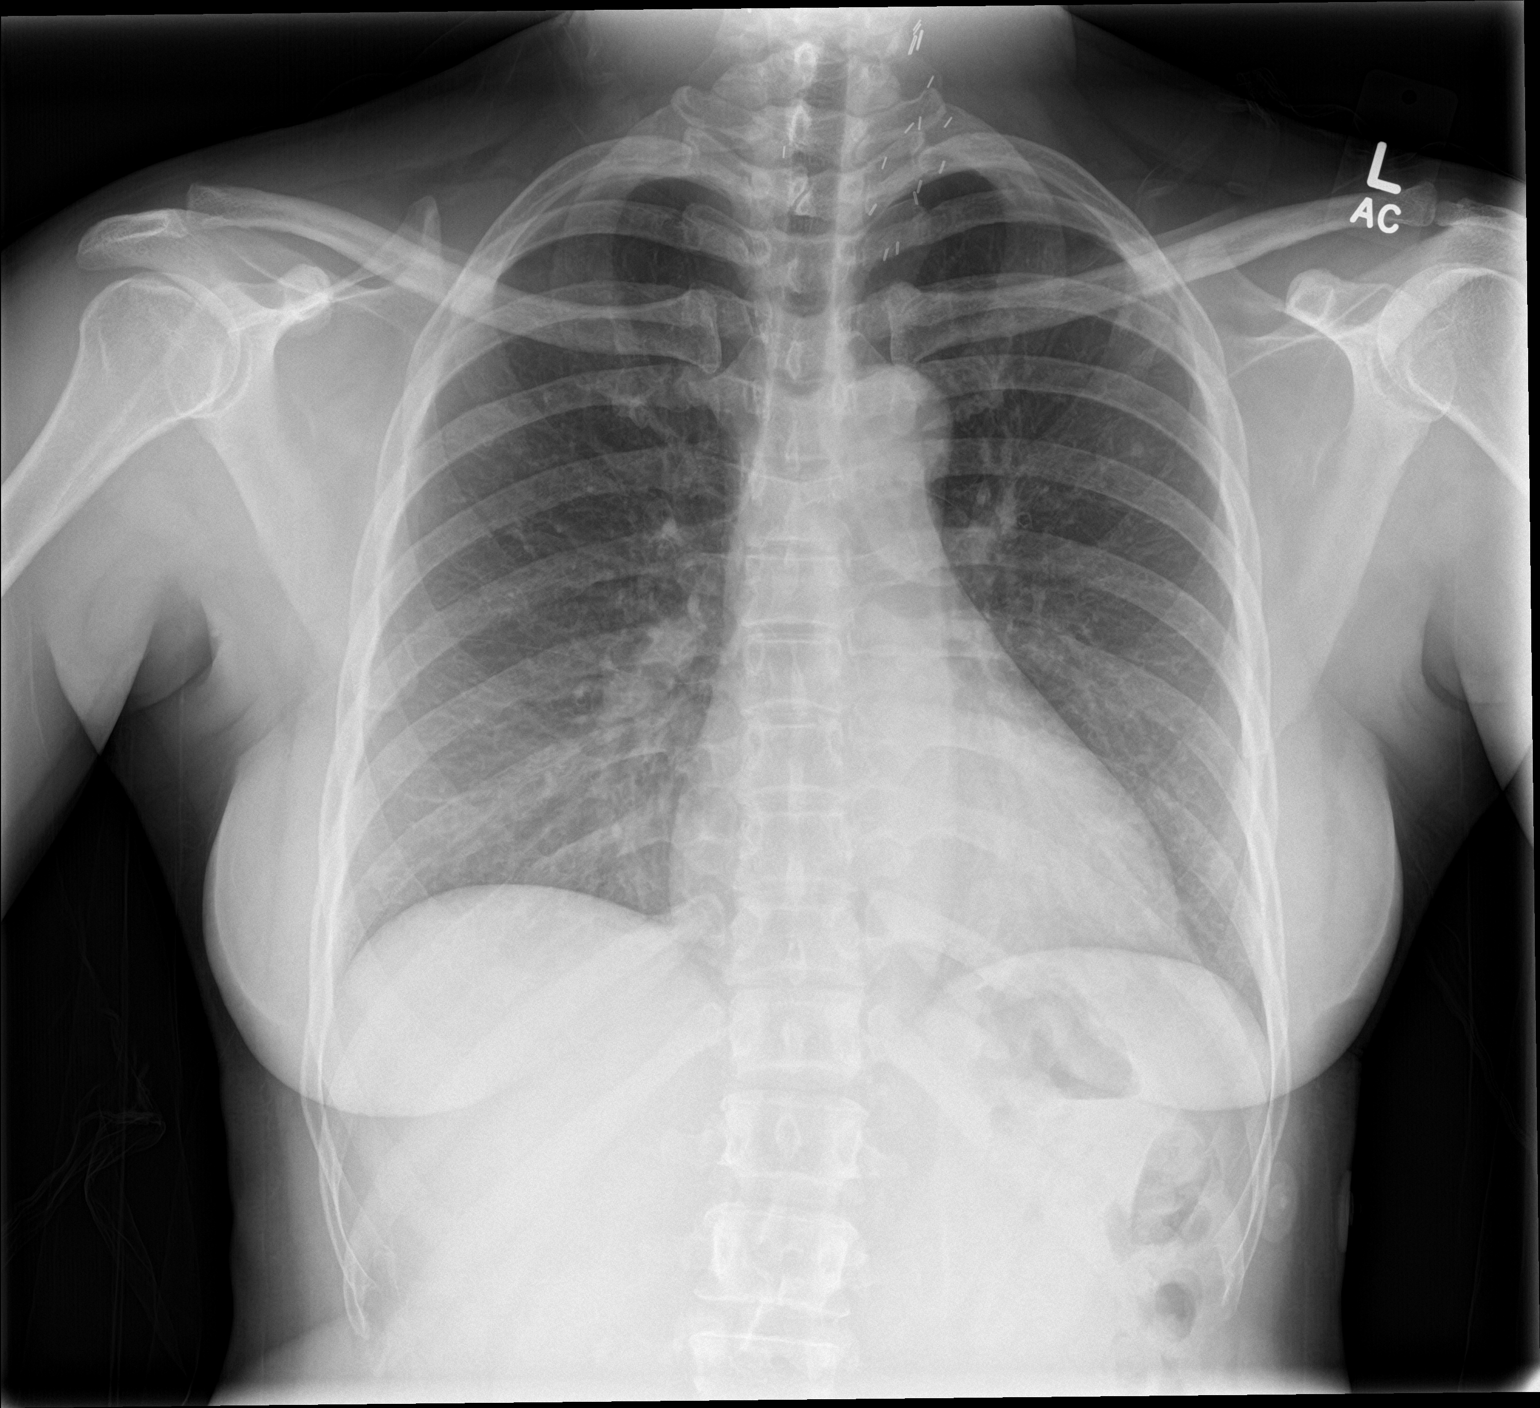

[chest lat]
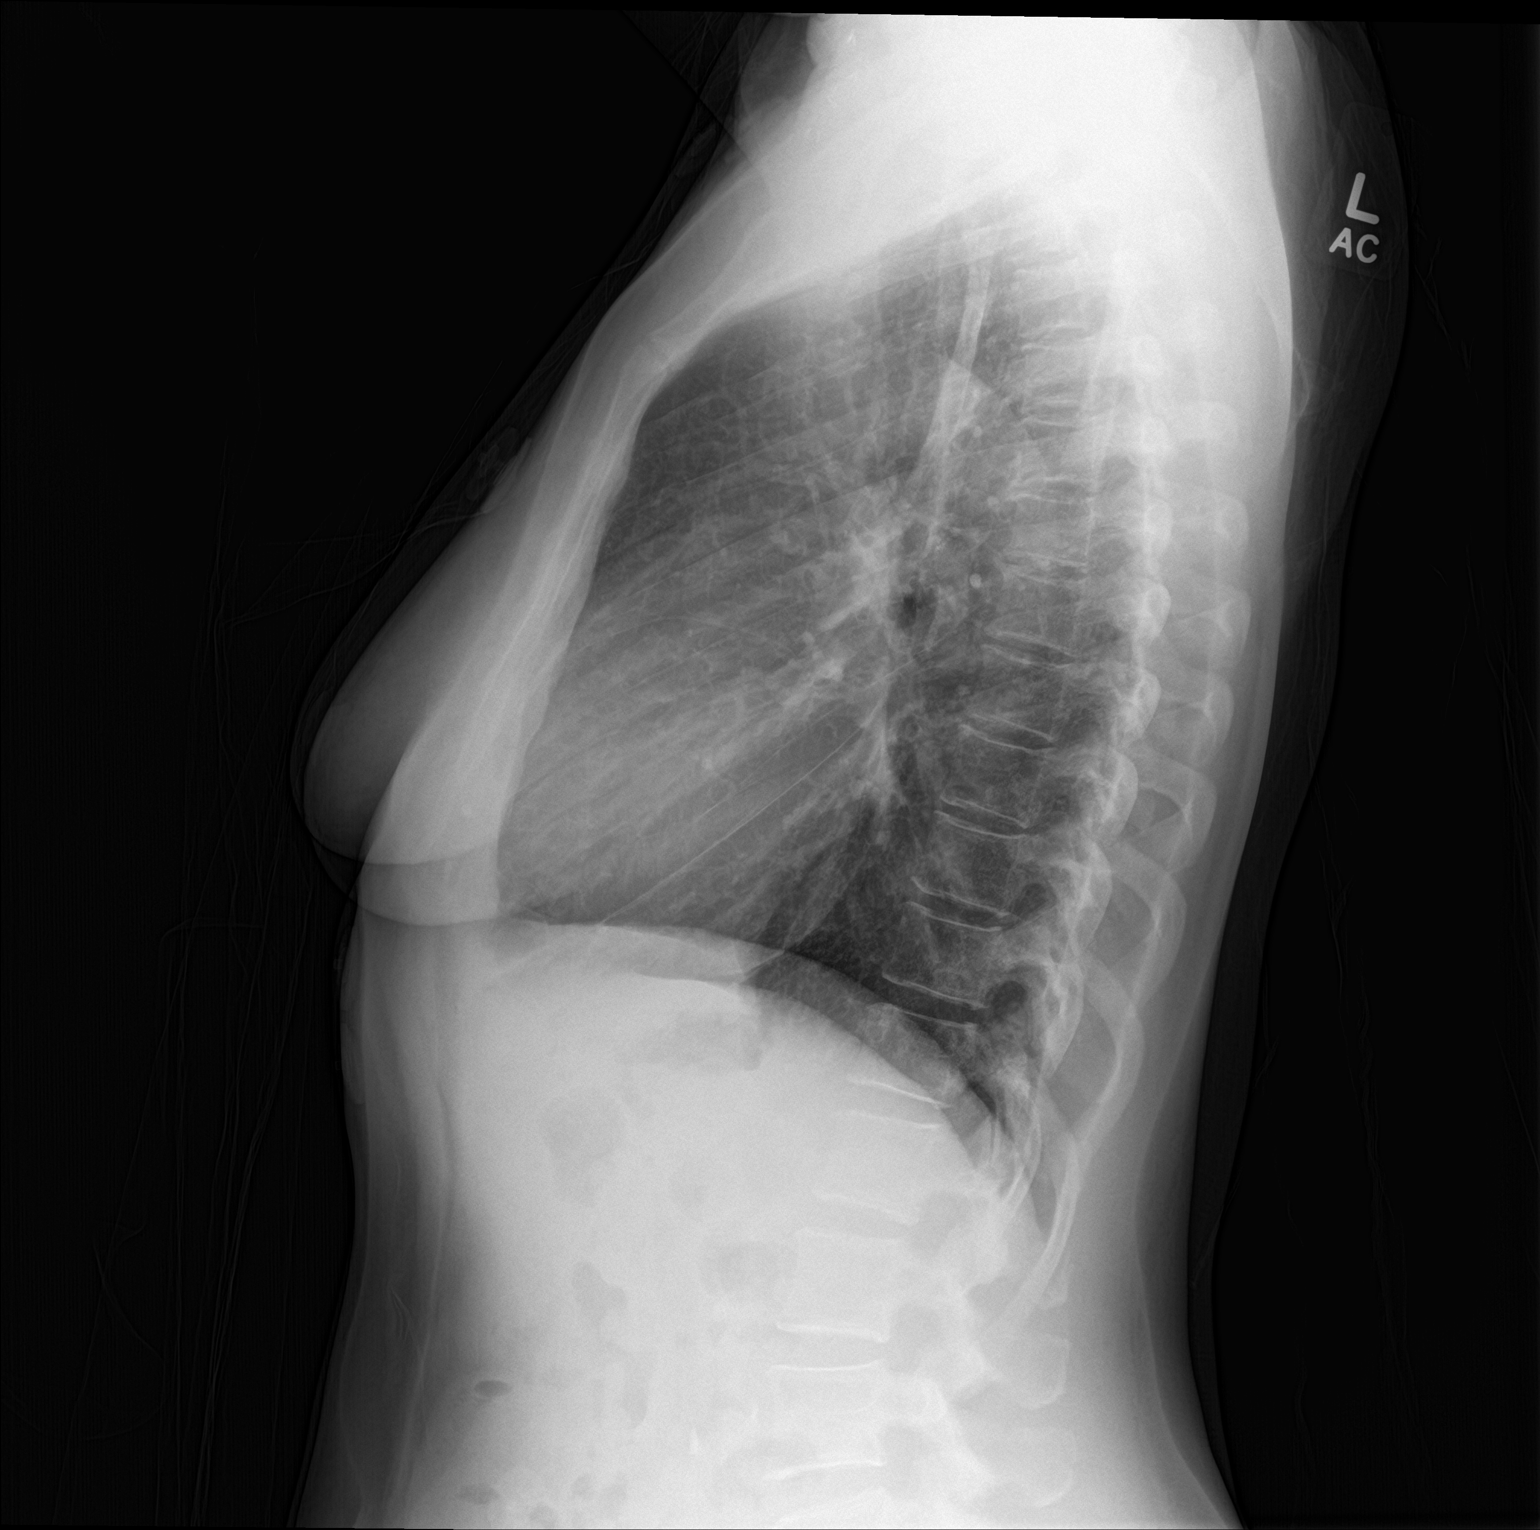

[2 of 2 positions shown; findings below may reference images not displayed]

FINDINGS: Cardiomediastinal silhouette is normal. No pleural effusions or
focal consolidations. Mild bronchitic changes. Strandy densities
RIGHT lung base. Trachea projects midline and there is no
pneumothorax. Soft tissue planes and included osseous structures are
non-suspicious. Surgical clips in the neck most compatible with
thyroidectomy.
IMPRESSION: Mild bronchitic changes and RIGHT lung base atelectasis.

## 2018-03-14 DIAGNOSIS — I2119 ST elevation (STEMI) myocardial infarction involving other coronary artery of inferior wall: Secondary | ICD-10-CM

## 2018-03-14 HISTORY — DX: ST elevation (STEMI) myocardial infarction involving other coronary artery of inferior wall: I21.19

## 2018-03-22 ENCOUNTER — Emergency Department (HOSPITAL_BASED_OUTPATIENT_CLINIC_OR_DEPARTMENT_OTHER): Payer: 59

## 2018-03-22 ENCOUNTER — Inpatient Hospital Stay (HOSPITAL_BASED_OUTPATIENT_CLINIC_OR_DEPARTMENT_OTHER)
Admission: EM | Admit: 2018-03-22 | Discharge: 2018-03-25 | DRG: 247 | Disposition: A | Discharge status: Home / Self Care | Payer: 59 | Attending: Interventional Cardiology | Admitting: Interventional Cardiology

## 2018-03-22 ENCOUNTER — Other Ambulatory Visit: Payer: Self-pay

## 2018-03-22 ENCOUNTER — Encounter (HOSPITAL_BASED_OUTPATIENT_CLINIC_OR_DEPARTMENT_OTHER): Payer: Self-pay | Admitting: *Deleted

## 2018-03-22 DIAGNOSIS — I4729 Other ventricular tachycardia: Secondary | ICD-10-CM

## 2018-03-22 DIAGNOSIS — Z79899 Other long term (current) drug therapy: Secondary | ICD-10-CM

## 2018-03-22 DIAGNOSIS — F17220 Nicotine dependence, chewing tobacco, uncomplicated: Secondary | ICD-10-CM | POA: Diagnosis present

## 2018-03-22 DIAGNOSIS — E079 Disorder of thyroid, unspecified: Secondary | ICD-10-CM | POA: Diagnosis present

## 2018-03-22 DIAGNOSIS — R946 Abnormal results of thyroid function studies: Secondary | ICD-10-CM | POA: Diagnosis present

## 2018-03-22 DIAGNOSIS — I10 Essential (primary) hypertension: Secondary | ICD-10-CM | POA: Diagnosis present

## 2018-03-22 DIAGNOSIS — E876 Hypokalemia: Secondary | ICD-10-CM

## 2018-03-22 DIAGNOSIS — I2510 Atherosclerotic heart disease of native coronary artery without angina pectoris: Secondary | ICD-10-CM

## 2018-03-22 DIAGNOSIS — E119 Type 2 diabetes mellitus without complications: Secondary | ICD-10-CM

## 2018-03-22 DIAGNOSIS — I213 ST elevation (STEMI) myocardial infarction of unspecified site: Secondary | ICD-10-CM

## 2018-03-22 DIAGNOSIS — E785 Hyperlipidemia, unspecified: Secondary | ICD-10-CM | POA: Diagnosis present

## 2018-03-22 DIAGNOSIS — Z881 Allergy status to other antibiotic agents status: Secondary | ICD-10-CM

## 2018-03-22 DIAGNOSIS — E7800 Pure hypercholesterolemia, unspecified: Secondary | ICD-10-CM | POA: Diagnosis present

## 2018-03-22 DIAGNOSIS — I2119 ST elevation (STEMI) myocardial infarction involving other coronary artery of inferior wall: Secondary | ICD-10-CM | POA: Diagnosis not present

## 2018-03-22 DIAGNOSIS — I472 Ventricular tachycardia: Secondary | ICD-10-CM | POA: Diagnosis present

## 2018-03-22 DIAGNOSIS — R7989 Other specified abnormal findings of blood chemistry: Secondary | ICD-10-CM

## 2018-03-22 DIAGNOSIS — E1165 Type 2 diabetes mellitus with hyperglycemia: Secondary | ICD-10-CM | POA: Diagnosis present

## 2018-03-22 DIAGNOSIS — K219 Gastro-esophageal reflux disease without esophagitis: Secondary | ICD-10-CM | POA: Diagnosis present

## 2018-03-22 DIAGNOSIS — Z79890 Hormone replacement therapy (postmenopausal): Secondary | ICD-10-CM

## 2018-03-22 DIAGNOSIS — R000 Tachycardia, unspecified: Secondary | ICD-10-CM

## 2018-03-22 DIAGNOSIS — Z720 Tobacco use: Secondary | ICD-10-CM

## 2018-03-22 DIAGNOSIS — I255 Ischemic cardiomyopathy: Secondary | ICD-10-CM

## 2018-03-22 DIAGNOSIS — Z880 Allergy status to penicillin: Secondary | ICD-10-CM

## 2018-03-22 DIAGNOSIS — Z888 Allergy status to other drugs, medicaments and biological substances status: Secondary | ICD-10-CM

## 2018-03-22 HISTORY — DX: Other ventricular tachycardia: I47.29

## 2018-03-22 HISTORY — DX: Type 2 diabetes mellitus without complications: E11.9

## 2018-03-22 HISTORY — DX: Tobacco use: Z72.0

## 2018-03-22 HISTORY — DX: ST elevation (STEMI) myocardial infarction involving other coronary artery of inferior wall: I21.19

## 2018-03-22 HISTORY — DX: Ventricular tachycardia: I47.2

## 2018-03-22 HISTORY — DX: Ischemic cardiomyopathy: I25.5

## 2018-03-22 HISTORY — DX: Atherosclerotic heart disease of native coronary artery without angina pectoris: I25.10

## 2018-03-22 LAB — CBC
HCT: 42.4 % (ref 36.0–46.0)
Hemoglobin: 13.7 g/dL (ref 12.0–15.0)
MCH: 28.5 pg (ref 26.0–34.0)
MCHC: 32.3 g/dL (ref 30.0–36.0)
MCV: 88.1 fL (ref 80.0–100.0)
Platelets: 325 10*3/uL (ref 150–400)
RBC: 4.81 MIL/uL (ref 3.87–5.11)
RDW: 13.7 % (ref 11.5–15.5)
WBC: 8.6 10*3/uL (ref 4.0–10.5)
nRBC: 0 % (ref 0.0–0.2)

## 2018-03-22 LAB — TROPONIN I: Troponin I: <0.03 ng/mL (ref <0.03)

## 2018-03-22 LAB — BASIC METABOLIC PANEL
Anion gap: 12 (ref 5–15)
BUN: 22 mg/dL — ABNORMAL HIGH (ref 6–20)
CHLORIDE: 98 mmol/L (ref 98–111)
CO2: 25 mmol/L (ref 22–32)
Calcium: 9.5 mg/dL (ref 8.9–10.3)
Creatinine, Ser: 1.35 mg/dL — ABNORMAL HIGH (ref 0.44–1.00)
GFR calc Af Amer: 52 mL/min — ABNORMAL LOW (ref >60)
GFR calc non Af Amer: 45 mL/min — ABNORMAL LOW (ref >60)
Glucose, Bld: 148 mg/dL — ABNORMAL HIGH (ref 70–99)
Potassium: 3.1 mmol/L — ABNORMAL LOW (ref 3.5–5.1)
Sodium: 135 mmol/L (ref 135–145)

## 2018-03-22 MED ORDER — ASPIRIN 81 MG PO CHEW
CHEWABLE_TABLET | ORAL | Status: AC
  Filled 2018-03-22: qty 4

## 2018-03-22 MED ORDER — ASPIRIN 81 MG PO CHEW
324.0000 mg | CHEWABLE_TABLET | Freq: Once | ORAL | Status: AC
  Administered 2018-03-22: 324 mg via ORAL

## 2018-03-22 MED ORDER — HEPARIN SODIUM (PORCINE) 5000 UNIT/ML IJ SOLN
60.0000 [IU]/kg | Freq: Once | INTRAMUSCULAR | Status: AC
  Administered 2018-03-22: 3900 [IU] via INTRAVENOUS

## 2018-03-22 MED ORDER — SODIUM CHLORIDE 0.9 % IV SOLN: INTRAVENOUS | Status: DC

## 2018-03-22 MED ORDER — ONDANSETRON HCL 4 MG/2ML IJ SOLN
4.0000 mg | Freq: Once | INTRAMUSCULAR | Status: AC
  Administered 2018-03-22: 4 mg via INTRAVENOUS
  Filled 2018-03-22: qty 2

## 2018-03-22 MED ORDER — HEPARIN SODIUM (PORCINE) 5000 UNIT/ML IJ SOLN
60.0000 [IU]/kg | Freq: Once | INTRAMUSCULAR | Status: DC
  Filled 2018-03-22: qty 1

## 2018-03-22 MED ORDER — HEPARIN (PORCINE) 25000 UT/250ML-% IV SOLN
INTRAVENOUS | Status: AC
  Filled 2018-03-22: qty 250

## 2018-03-22 MED ORDER — FENTANYL CITRATE (PF) 100 MCG/2ML IJ SOLN
50.0000 ug | Freq: Once | INTRAMUSCULAR | Status: AC
  Administered 2018-03-23: 50 ug via INTRAVENOUS
  Filled 2018-03-22: qty 2

## 2018-03-22 MED ORDER — ALUM & MAG HYDROXIDE-SIMETH 200-200-20 MG/5ML PO SUSP
30.0000 mL | Freq: Once | ORAL | Status: AC
  Administered 2018-03-22: 30 mL via ORAL
  Filled 2018-03-22: qty 30

## 2018-03-22 NOTE — ED Notes (Signed)
After blood draw- pt reports feeling hot. Pt appeared to be diaphoretic. Pt then began vomiting. zofran ordered.

## 2018-03-22 NOTE — ED Notes (Signed)
ED Provider at bedside. 

## 2018-03-22 NOTE — ED Triage Notes (Signed)
Pt reports chest pain since 9 pm. States pain started after taking bactrim and flagyl and she believes it was caused by the medications

## 2018-03-22 NOTE — ED Notes (Signed)
Patient transported to X-ray 

## 2018-03-22 NOTE — ED Provider Notes (Signed)
[facility] DEPART[facility] Provider Note   CSN: [id number] Arrival date & time: 03/22/18  2308     History   Chief Complaint Chief Complaint  Patient presents with  . Chest Pain    HPI Brooke Williamson is a 53 y.o. female.  The history is provided by the patient.  Chest Pain  Pain location:  Epigastric Pain quality: dull   Pain radiates to:  Does not radiate Pain severity:  Severe Onset quality:  Sudden Duration:  2 hours Timing:  Constant Progression:  Worsening Chronicity:  Recurrent (had with her lat ulcer ) Context: at rest   Relieved by:  Nothing Worsened by:  Nothing Ineffective treatments:  None tried Associated symptoms: vomiting   Associated symptoms: no back pain, no cough, no diaphoresis, no fever, no headache, no nausea, no numbness, no palpitations, no shortness of breath, no syncope and no weakness   Risk factors: high cholesterol and smoking   Pain in the epigastric area which started at rest at 9 pm without SOB n/v/d.  States she thinks it is related to the 2 new meds she started yesterday which are bactrim and flagyl.  She states she has had pain like this once in the past with an ulcer.  She is a smoke and has hypercholesterolemia.  She denies family history.  She denies back or neck pain.  There is no radiation of the pain.  She does not have a cardiologist.    Past Medical History:  Diagnosis Date  . GERD (gastroesophageal reflux disease)   . Hypercholesteremia   . Hypertension   . Thyroid disease   . Ulcer     There are no active problems to display for this patient.   Past Surgical History:  Procedure Laterality Date  . THYROID SURGERY       OB History   No obstetric history on file.      Home Medications    Prior to Admission medications   Medication Sig Start Date End Date Taking? Authorizing Provider  cyclobenzaprine (FLEXERIL) 10 MG tablet Take 1 tablet (10 mg total) by mouth 2 (two) times daily as needed  for up to 12 doses for muscle spasms. 12/01/17   Lennice Sites, DO  levothyroxine (SYNTHROID, LEVOTHROID) 88 MCG tablet  11/22/17   [provider]  Saccharomyces boulardii (PROBIOTIC) 250 MG CAPS Take 1 capsule by mouth daily. 09/17/17   Law, Bea Graff, PA-C  spironolactone-hydrochlorothiazide (ALDACTAZIDE) 25-25 MG per tablet Take 1 tablet by mouth daily.    [provider]    Family History No family history on file.  Social History Social History   Tobacco Use  . Smoking status: Current Every Day Smoker    Types: Cigarettes  . Smokeless tobacco: Never Used  Substance Use Topics  . Alcohol use: Not Currently    Comment: occ  . Drug use: No     Allergies   Nitrofurantoin monohyd macro; Ciprofloxacin; and Penicillins   Review of Systems Review of Systems  Constitutional: Negative for diaphoresis and fever.  Respiratory: Negative for cough and shortness of breath.   Cardiovascular: Positive for chest pain. Negative for palpitations and syncope.  Gastrointestinal: Positive for vomiting. Negative for nausea.  Musculoskeletal: Negative for back pain and neck pain.  Neurological: Negative for weakness, numbness and headaches.  All other systems reviewed and are negative.    Physical Exam Updated Vital Signs BP 137/87 (BP Location: Right Arm)   Pulse 94   Temp 98.1 F (  36.7 C) (Oral)   Resp 17   Ht 5' (1.524 m)   Wt 64.9 kg   LMP 03/29/2016   SpO2 99%   BMI 27.93 kg/m   Physical Exam Vitals signs and nursing note reviewed.  Constitutional:      Appearance: She is not diaphoretic.  HENT:     Head: Normocephalic and atraumatic.  Eyes:     Pupils: Pupils are equal, round, and reactive to light.  Neck:     Musculoskeletal: Normal range of motion and neck supple.  Cardiovascular:     Rate and Rhythm: Normal rate and regular rhythm.     Heart sounds: Normal heart sounds.  Pulmonary:     Effort: Pulmonary effort is normal.     Breath sounds:  Normal breath sounds.  Abdominal:     General: Bowel sounds are normal.     Palpations: Abdomen is soft.  Musculoskeletal: Normal range of motion.     Right lower leg: She exhibits no tenderness. No edema.     Left lower leg: She exhibits no tenderness. No edema.  Skin:    General: Skin is warm and dry.     Capillary Refill: Capillary refill takes less than 2 seconds.     Coloration: Skin is not cyanotic or pale.  Neurological:     General: No focal deficit present.     Mental Status: She is alert and oriented to person, place, and time.  Psychiatric:        Mood and Affect: Mood is anxious.      ED Treatments / Results  Labs (all labs ordered are listed, but only abnormal results are displayed) Results for orders placed or performed during the hospital encounter of 44/81/85  Basic metabolic panel  Result Value Ref Range   Sodium 135 135 - 145 mmol/L   Potassium 3.1 (L) 3.5 - 5.1 mmol/L   Chloride 98 98 - 111 mmol/L   CO2 25 22 - 32 mmol/L   Glucose, Bld 148 (H) 70 - 99 mg/dL   BUN 22 (H) 6 - 20 mg/dL   Creatinine, Ser 1.35 (H) 0.44 - 1.00 mg/dL   Calcium 9.5 8.9 - 10.3 mg/dL   GFR calc non Af Amer 45 (L) >60 mL/min   GFR calc Af Amer 52 (L) >60 mL/min   Anion gap 12 5 - 15  CBC  Result Value Ref Range   WBC 8.6 4.0 - 10.5 K/uL   RBC 4.81 3.87 - 5.11 MIL/uL   Hemoglobin 13.7 12.0 - 15.0 g/dL   HCT 42.4 36.0 - 46.0 %   MCV 88.1 80.0 - 100.0 fL   MCH 28.5 26.0 - 34.0 pg   MCHC 32.3 30.0 - 36.0 g/dL   RDW 13.7 11.5 - 15.5 %   Platelets 325 150 - 400 K/uL   nRBC 0.0 0.0 - 0.2 %  Troponin I - ONCE - STAT  Result Value Ref Range   Troponin I <0.03 <0.03 ng/mL   Dg Chest 2 View  Result Date: 03/22/2018 CLINICAL DATA:  Chest pain EXAM: CHEST - 2 VIEW COMPARISON:  04/29/2016 FINDINGS: The heart size and mediastinal contours are within normal limits. Both lungs are clear. The visualized skeletal structures are unremarkable. Postsurgical changes at the thoracic inlet.  IMPRESSION: No active cardiopulmonary disease. Electronically Signed   By: Donavan Foil M.D.   On: 03/22/2018 23:37    EKG EKG Interpretation  Date/Time:  Thursday March 22 2018 23:43:28 EST Ventricular Rate:  93 PR Interval:    QRS Duration: 87 QT Interval:  370 QTC Calculation: 461 R Axis:   90 Text Interpretation:  Sinus rhythm Probable left atrial enlargement Probable LVH with secondary repol abnrm Inferoposterior infarct, acute (RCA) Lateral infarct, acute Probable RV involvement, suggest recording right precordial leads Baseline wander in lead(s) I STEMI Confirmed by Randal Buba, Royetta Probus ([id number]) on 03/22/2018 11:51:28 PM   Radiology Dg Chest 2 View  Result Date: 03/22/2018 CLINICAL DATA:  Chest pain EXAM: CHEST - 2 VIEW COMPARISON:  04/29/2016 FINDINGS: The heart size and mediastinal contours are within normal limits. Both lungs are clear. The visualized skeletal structures are unremarkable. Postsurgical changes at the thoracic inlet. IMPRESSION: No active cardiopulmonary disease. Electronically Signed   By: Donavan Foil M.D.   On: 03/22/2018 23:37    Procedures Procedures (including critical care time)  Medications Ordered in ED Medications  0.9 %  sodium chloride infusion (has no administration in time range)  heparin injection 3,900 Units (has no administration in time range)  0.9 %  sodium chloride infusion (has no administration in time range)  heparin injection 3,900 Units (has no administration in time range)  fentaNYL (SUBLIMAZE) injection 50 mcg (has no administration in time range)  aspirin 81 MG chewable tablet (has no administration in time range)  heparin 25000-0.45 UT/250ML-% infusion (has no administration in time range)  ondansetron (ZOFRAN) injection 4 mg (4 mg Intravenous Given 03/22/18 2326)  alum & mag hydroxide-simeth (MAALOX/MYLANTA) 200-200-20 MG/5ML suspension 30 mL (30 mLs Oral Given 03/22/18 2338)  aspirin chewable tablet 324 mg (324 mg Oral Given 03/22/18  2351)  aspirin chewable tablet 324 mg (324 mg Oral Given 03/22/18 2347)   STEMI not present on the original EKG in triage.  EDP was at the desk and saw change on the monitor and immediately ordered a 2nd EKG.  The second EKG clearly demonstrated a STEMI.     As there appears to be a posterior component no nitroglycerin will be given.    Final Clinical Impressions(s) / ED Diagnoses  Symmetric pulses in all four extremities.  Case d/w Dr. Tamala Julian of cardiology who accepts the patient in transfer.     Patient had one episode of emesis in the ED post ASA.      Will transfer to cath lab for STEMI.      Naylee Frankowski, MD 03/23/18 [id number]

## 2018-03-23 ENCOUNTER — Inpatient Hospital Stay (HOSPITAL_COMMUNITY): Payer: 59

## 2018-03-23 ENCOUNTER — Inpatient Hospital Stay (HOSPITAL_COMMUNITY)
Admission: EM | Disposition: A | Discharge status: Home / Self Care | Payer: Self-pay | Source: Home / Self Care | Attending: Interventional Cardiology

## 2018-03-23 ENCOUNTER — Encounter (HOSPITAL_BASED_OUTPATIENT_CLINIC_OR_DEPARTMENT_OTHER): Payer: Self-pay | Admitting: Emergency Medicine

## 2018-03-23 ENCOUNTER — Encounter (HOSPITAL_COMMUNITY): Payer: Self-pay

## 2018-03-23 DIAGNOSIS — I10 Essential (primary) hypertension: Secondary | ICD-10-CM | POA: Diagnosis present

## 2018-03-23 DIAGNOSIS — Z880 Allergy status to penicillin: Secondary | ICD-10-CM | POA: Diagnosis not present

## 2018-03-23 DIAGNOSIS — I2121 ST elevation (STEMI) myocardial infarction involving left circumflex coronary artery: Secondary | ICD-10-CM | POA: Diagnosis not present

## 2018-03-23 DIAGNOSIS — I2510 Atherosclerotic heart disease of native coronary artery without angina pectoris: Secondary | ICD-10-CM

## 2018-03-23 DIAGNOSIS — K219 Gastro-esophageal reflux disease without esophagitis: Secondary | ICD-10-CM | POA: Diagnosis present

## 2018-03-23 DIAGNOSIS — F17220 Nicotine dependence, chewing tobacco, uncomplicated: Secondary | ICD-10-CM | POA: Diagnosis present

## 2018-03-23 DIAGNOSIS — I2119 ST elevation (STEMI) myocardial infarction involving other coronary artery of inferior wall: Secondary | ICD-10-CM

## 2018-03-23 DIAGNOSIS — E1165 Type 2 diabetes mellitus with hyperglycemia: Secondary | ICD-10-CM | POA: Diagnosis present

## 2018-03-23 DIAGNOSIS — Z79899 Other long term (current) drug therapy: Secondary | ICD-10-CM | POA: Diagnosis not present

## 2018-03-23 DIAGNOSIS — E876 Hypokalemia: Secondary | ICD-10-CM | POA: Diagnosis present

## 2018-03-23 DIAGNOSIS — E7800 Pure hypercholesterolemia, unspecified: Secondary | ICD-10-CM | POA: Diagnosis present

## 2018-03-23 DIAGNOSIS — Z888 Allergy status to other drugs, medicaments and biological substances status: Secondary | ICD-10-CM | POA: Diagnosis not present

## 2018-03-23 DIAGNOSIS — Z79890 Hormone replacement therapy (postmenopausal): Secondary | ICD-10-CM | POA: Diagnosis not present

## 2018-03-23 DIAGNOSIS — I2129 ST elevation (STEMI) myocardial infarction involving other sites: Secondary | ICD-10-CM

## 2018-03-23 DIAGNOSIS — I213 ST elevation (STEMI) myocardial infarction of unspecified site: Secondary | ICD-10-CM | POA: Diagnosis present

## 2018-03-23 DIAGNOSIS — Z881 Allergy status to other antibiotic agents status: Secondary | ICD-10-CM | POA: Diagnosis not present

## 2018-03-23 DIAGNOSIS — E785 Hyperlipidemia, unspecified: Secondary | ICD-10-CM | POA: Diagnosis present

## 2018-03-23 DIAGNOSIS — I255 Ischemic cardiomyopathy: Secondary | ICD-10-CM | POA: Diagnosis present

## 2018-03-23 DIAGNOSIS — R946 Abnormal results of thyroid function studies: Secondary | ICD-10-CM | POA: Diagnosis present

## 2018-03-23 DIAGNOSIS — I472 Ventricular tachycardia: Secondary | ICD-10-CM | POA: Diagnosis present

## 2018-03-23 DIAGNOSIS — E079 Disorder of thyroid, unspecified: Secondary | ICD-10-CM | POA: Diagnosis present

## 2018-03-23 HISTORY — PX: LEFT HEART CATH AND CORONARY ANGIOGRAPHY: CATH118249

## 2018-03-23 HISTORY — PX: CORONARY/GRAFT ACUTE MI REVASCULARIZATION: CATH118305

## 2018-03-23 LAB — CBC
HCT: 37.7 % (ref 36.0–46.0)
Hemoglobin: 12 g/dL (ref 12.0–15.0)
MCH: 27.4 pg (ref 26.0–34.0)
MCHC: 31.8 g/dL (ref 30.0–36.0)
MCV: 86.1 fL (ref 80.0–100.0)
NRBC: 0 % (ref 0.0–0.2)
PLATELETS: 280 10*3/uL (ref 150–400)
RBC: 4.38 MIL/uL (ref 3.87–5.11)
RDW: 13.3 % (ref 11.5–15.5)
WBC: 13.9 10*3/uL — AB (ref 4.0–10.5)

## 2018-03-23 LAB — BASIC METABOLIC PANEL
ANION GAP: 9 (ref 5–15)
Anion gap: 12 (ref 5–15)
BUN: 15 mg/dL (ref 6–20)
BUN: 12 mg/dL (ref 6–20)
CO2: 21 mmol/L — ABNORMAL LOW (ref 22–32)
CO2: 21 mmol/L — ABNORMAL LOW (ref 22–32)
Calcium: 8.8 mg/dL — ABNORMAL LOW (ref 8.9–10.3)
Calcium: 8.8 mg/dL — ABNORMAL LOW (ref 8.9–10.3)
Chloride: 101 mmol/L (ref 98–111)
Chloride: 103 mmol/L (ref 98–111)
Creatinine, Ser: 1.13 mg/dL — ABNORMAL HIGH (ref 0.44–1.00)
Creatinine, Ser: 1.01 mg/dL — ABNORMAL HIGH (ref 0.44–1.00)
GFR calc Af Amer: >60 mL/min (ref >60)
GFR calc non Af Amer: 55 mL/min — ABNORMAL LOW (ref >60)
GFR calc non Af Amer: >60 mL/min (ref >60)
Glucose, Bld: 170 mg/dL — ABNORMAL HIGH (ref 70–99)
Glucose, Bld: 160 mg/dL — ABNORMAL HIGH (ref 70–99)
POTASSIUM: 4.2 mmol/L (ref 3.5–5.1)
Potassium: 4.3 mmol/L (ref 3.5–5.1)
Sodium: 134 mmol/L — ABNORMAL LOW (ref 135–145)
Sodium: 133 mmol/L — ABNORMAL LOW (ref 135–145)

## 2018-03-23 LAB — PLATELET COUNT: Platelets: 267 10*3/uL (ref 150–400)

## 2018-03-23 LAB — LIPID PANEL
CHOL/HDL RATIO: 3.9 ratio
Cholesterol: 212 mg/dL — ABNORMAL HIGH (ref 0–200)
HDL: 54 mg/dL (ref >40)
LDL Cholesterol: 152 mg/dL — ABNORMAL HIGH (ref 0–99)
TRIGLYCERIDES: 29 mg/dL (ref <150)
VLDL: 6 mg/dL (ref 0–40)

## 2018-03-23 LAB — TROPONIN I
Troponin I: >65 ng/mL (ref <0.03)
Troponin I: >65 ng/mL (ref <0.03)

## 2018-03-23 LAB — POCT ACTIVATED CLOTTING TIME
ACTIVATED CLOTTING TIME: 290 s
Activated Clotting Time: 538 seconds

## 2018-03-23 LAB — MRSA PCR SCREENING: MRSA by PCR: NEGATIVE

## 2018-03-23 LAB — BRAIN NATRIURETIC PEPTIDE: B Natriuretic Peptide: 60.8 pg/mL (ref 0.0–100.0)

## 2018-03-23 LAB — ECHOCARDIOGRAM COMPLETE
Height: 60 in
Weight: 2288 oz

## 2018-03-23 LAB — GLUCOSE, CAPILLARY: GLUCOSE-CAPILLARY: 131 mg/dL — AB (ref 70–99)

## 2018-03-23 LAB — TSH: TSH: 1.113 u[IU]/mL (ref 0.350–4.500)

## 2018-03-23 SURGERY — CORONARY/GRAFT ACUTE MI REVASCULARIZATION
Anesthesia: LOCAL

## 2018-03-23 MED ORDER — NITROGLYCERIN 1 MG/10 ML FOR IR/CATH LAB
INTRA_ARTERIAL | Status: AC
  Filled 2018-03-23: qty 10

## 2018-03-23 MED ORDER — TIROFIBAN HCL IV 12.5 MG/250 ML
0.1500 ug/kg/min | INTRAVENOUS | Status: AC
  Administered 2018-03-23: 0.15 ug/kg/min via INTRAVENOUS
  Filled 2018-03-23: qty 250

## 2018-03-23 MED ORDER — HEPARIN SODIUM (PORCINE) 1000 UNIT/ML IJ SOLN
INTRAMUSCULAR | Status: DC | PRN
  Administered 2018-03-23: 2000 [IU] via INTRAVENOUS
  Administered 2018-03-23: 6000 [IU] via INTRAVENOUS

## 2018-03-23 MED ORDER — SODIUM CHLORIDE 0.9 % IV SOLN
INTRAVENOUS | Status: AC | PRN
  Administered 2018-03-23: 65 mL/h via INTRAVENOUS

## 2018-03-23 MED ORDER — LIDOCAINE HCL (PF) 1 % IJ SOLN
INTRAMUSCULAR | Status: AC
  Filled 2018-03-23: qty 30

## 2018-03-23 MED ORDER — ONDANSETRON HCL 4 MG/2ML IJ SOLN
INTRAMUSCULAR | Status: AC
  Filled 2018-03-23: qty 2

## 2018-03-23 MED ORDER — VERAPAMIL HCL 2.5 MG/ML IV SOLN
INTRAVENOUS | Status: DC | PRN
  Administered 2018-03-23: 10 mL via INTRA_ARTERIAL

## 2018-03-23 MED ORDER — FENTANYL CITRATE (PF) 100 MCG/2ML IJ SOLN
INTRAMUSCULAR | Status: DC | PRN
  Administered 2018-03-23: 50 ug via INTRAVENOUS

## 2018-03-23 MED ORDER — LIDOCAINE HCL (PF) 1 % IJ SOLN
INTRAMUSCULAR | Status: DC | PRN
  Administered 2018-03-23: 5 mL via SUBCUTANEOUS

## 2018-03-23 MED ORDER — FENTANYL CITRATE (PF) 100 MCG/2ML IJ SOLN
INTRAMUSCULAR | Status: AC
  Filled 2018-03-23: qty 2

## 2018-03-23 MED ORDER — MIDAZOLAM HCL 2 MG/2ML IJ SOLN
INTRAMUSCULAR | Status: DC | PRN
  Administered 2018-03-23: 1 mg via INTRAVENOUS

## 2018-03-23 MED ORDER — ONDANSETRON HCL 4 MG/2ML IJ SOLN
INTRAMUSCULAR | Status: DC | PRN
  Administered 2018-03-23: 4 mg via INTRAVENOUS

## 2018-03-23 MED ORDER — SODIUM CHLORIDE 0.9 % IV SOLN: 250.0000 mL | INTRAVENOUS | Status: DC | PRN

## 2018-03-23 MED ORDER — LABETALOL HCL 5 MG/ML IV SOLN: 10.0000 mg | INTRAVENOUS | Status: AC | PRN

## 2018-03-23 MED ORDER — SODIUM CHLORIDE 0.9% FLUSH
3.0000 mL | Freq: Two times a day (BID) | INTRAVENOUS | Status: DC
  Administered 2018-03-23 – 2018-03-25 (×4): 3 mL via INTRAVENOUS

## 2018-03-23 MED ORDER — HEPARIN (PORCINE) IN NACL 1000-0.9 UT/500ML-% IV SOLN
INTRAVENOUS | Status: DC | PRN
  Administered 2018-03-23 (×2): 500 mL

## 2018-03-23 MED ORDER — METOPROLOL TARTRATE 12.5 MG HALF TABLET
12.5000 mg | ORAL_TABLET | Freq: Two times a day (BID) | ORAL | Status: DC
  Administered 2018-03-23 (×2): 12.5 mg via ORAL
  Filled 2018-03-23 (×2): qty 1

## 2018-03-23 MED ORDER — HEPARIN SODIUM (PORCINE) 5000 UNIT/ML IJ SOLN
5000.0000 [IU] | Freq: Three times a day (TID) | INTRAMUSCULAR | Status: DC
  Administered 2018-03-23 – 2018-03-25 (×4): 5000 [IU] via SUBCUTANEOUS
  Filled 2018-03-23 (×5): qty 1

## 2018-03-23 MED ORDER — HEPARIN SODIUM (PORCINE) 1000 UNIT/ML IJ SOLN
INTRAMUSCULAR | Status: AC
  Filled 2018-03-23: qty 1

## 2018-03-23 MED ORDER — TIROFIBAN HCL IN NACL 5-0.9 MG/100ML-% IV SOLN
INTRAVENOUS | Status: AC | PRN
  Administered 2018-03-23: 0.15 ug/kg/min via INTRAVENOUS

## 2018-03-23 MED ORDER — GUAIFENESIN-DM 100-10 MG/5ML PO SYRP
5.0000 mL | ORAL_SOLUTION | ORAL | Status: DC | PRN
  Administered 2018-03-23: 5 mL via ORAL
  Filled 2018-03-23 (×2): qty 5

## 2018-03-23 MED ORDER — VERAPAMIL HCL 2.5 MG/ML IV SOLN
INTRAVENOUS | Status: AC
  Filled 2018-03-23: qty 2

## 2018-03-23 MED ORDER — TIROFIBAN (AGGRASTAT) BOLUS VIA INFUSION
INTRAVENOUS | Status: DC | PRN
  Administered 2018-03-23: 1622.5 ug via INTRAVENOUS

## 2018-03-23 MED ORDER — SODIUM CHLORIDE 0.9 % IV SOLN
INTRAVENOUS | Status: AC
  Administered 2018-03-23: 03:00:00 via INTRAVENOUS

## 2018-03-23 MED ORDER — TICAGRELOR 90 MG PO TABS
ORAL_TABLET | ORAL | Status: AC
  Filled 2018-03-23: qty 2

## 2018-03-23 MED ORDER — TICAGRELOR 90 MG PO TABS
ORAL_TABLET | ORAL | Status: DC | PRN
  Administered 2018-03-23: 180 mg via ORAL

## 2018-03-23 MED ORDER — TICAGRELOR 90 MG PO TABS
90.0000 mg | ORAL_TABLET | Freq: Two times a day (BID) | ORAL | Status: DC
  Administered 2018-03-23 – 2018-03-25 (×5): 90 mg via ORAL
  Filled 2018-03-23 (×5): qty 1

## 2018-03-23 MED ORDER — OXYCODONE HCL 5 MG PO TABS
5.0000 mg | ORAL_TABLET | ORAL | Status: DC | PRN
  Administered 2018-03-23 – 2018-03-24 (×4): 5 mg via ORAL
  Filled 2018-03-23 (×4): qty 1

## 2018-03-23 MED ORDER — ASPIRIN 81 MG PO CHEW
81.0000 mg | CHEWABLE_TABLET | Freq: Every day | ORAL | Status: DC
  Administered 2018-03-23 – 2018-03-25 (×3): 81 mg via ORAL
  Filled 2018-03-23 (×3): qty 1

## 2018-03-23 MED ORDER — HEPARIN (PORCINE) IN NACL 1000-0.9 UT/500ML-% IV SOLN
INTRAVENOUS | Status: AC
  Filled 2018-03-23: qty 1000

## 2018-03-23 MED ORDER — ACETAMINOPHEN 325 MG PO TABS: 650.0000 mg | ORAL_TABLET | ORAL | Status: DC | PRN

## 2018-03-23 MED ORDER — TIROFIBAN HCL IN NACL 5-0.9 MG/100ML-% IV SOLN
INTRAVENOUS | Status: AC
  Filled 2018-03-23: qty 100

## 2018-03-23 MED ORDER — ATORVASTATIN CALCIUM 80 MG PO TABS
80.0000 mg | ORAL_TABLET | Freq: Every day | ORAL | Status: DC
  Administered 2018-03-23 – 2018-03-24 (×2): 80 mg via ORAL
  Filled 2018-03-23 (×2): qty 1

## 2018-03-23 MED ORDER — IOHEXOL 350 MG/ML SOLN
INTRAVENOUS | Status: DC | PRN
  Administered 2018-03-23: 140 mL via INTRA_ARTERIAL

## 2018-03-23 MED ORDER — HYDRALAZINE HCL 20 MG/ML IJ SOLN: 5.0000 mg | INTRAMUSCULAR | Status: AC | PRN

## 2018-03-23 MED ORDER — SODIUM CHLORIDE 0.9% FLUSH: 3.0000 mL | INTRAVENOUS | Status: DC | PRN

## 2018-03-23 MED ORDER — POTASSIUM CHLORIDE CRYS ER 20 MEQ PO TBCR
40.0000 meq | EXTENDED_RELEASE_TABLET | Freq: Once | ORAL | Status: AC
  Administered 2018-03-23: 40 meq via ORAL
  Filled 2018-03-23: qty 2

## 2018-03-23 MED ORDER — ONDANSETRON HCL 4 MG/2ML IJ SOLN
4.0000 mg | Freq: Four times a day (QID) | INTRAMUSCULAR | Status: DC | PRN
  Administered 2018-03-23: 4 mg via INTRAVENOUS
  Filled 2018-03-23: qty 2

## 2018-03-23 MED ORDER — MIDAZOLAM HCL 2 MG/2ML IJ SOLN
INTRAMUSCULAR | Status: AC
  Filled 2018-03-23: qty 2

## 2018-03-23 SURGICAL SUPPLY — 22 items
BALLN SAPPHIRE 2.0X12 (BALLOONS) ×2
BALLN SAPPHIRE 2.5X12 (BALLOONS) ×2
BALLN SAPPHIRE ~~LOC~~ 3.0X10 (BALLOONS) ×1 IMPLANT
BALLOON SAPPHIRE 2.0X12 (BALLOONS) IMPLANT
BALLOON SAPPHIRE 2.5X12 (BALLOONS) IMPLANT
CATH INFINITI 5 FR JL3.5 (CATHETERS) ×1 IMPLANT
CATH VISTA GUIDE 6FR JR4 (CATHETERS) ×1 IMPLANT
CATH VISTA GUIDE 6FR XB3.5 (CATHETERS) ×1 IMPLANT
DEVICE RAD COMP TR BAND LRG (VASCULAR PRODUCTS) ×1 IMPLANT
ELECT DEFIB PAD ADLT CADENCE (PAD) ×1 IMPLANT
GLIDESHEATH SLEND A-KIT 6F 22G (SHEATH) ×1 IMPLANT
GUIDEWIRE INQWIRE 1.5J.035X260 (WIRE) IMPLANT
INQWIRE 1.5J .035X260CM (WIRE) ×2
KIT ENCORE 26 ADVANTAGE (KITS) ×1 IMPLANT
KIT HEART LEFT (KITS) ×2 IMPLANT
PACK CARDIAC CATHETERIZATION (CUSTOM PROCEDURE TRAY) ×2 IMPLANT
SHEATH PROBE COVER 6X72 (BAG) ×1 IMPLANT
STENT RESOLUTE ONYX 2.75X15 (Permanent Stent) ×1 IMPLANT
TRANSDUCER W/STOPCOCK (MISCELLANEOUS) ×2 IMPLANT
TUBING CIL FLEX 10 FLL-RA (TUBING) ×2 IMPLANT
WIRE ASAHI PROWATER 180CM (WIRE) ×2 IMPLANT
WIRE MINAMO 190 (WIRE) ×1 IMPLANT

## 2018-03-23 NOTE — ED Notes (Signed)
Attempted to call cath lab x 3 no answer.

## 2018-03-23 NOTE — Plan of Care (Signed)
  Problem: Cardiovascular: Goal: Vascular access site(s) Level 0-1 will be maintained Outcome: Progressing  TR band deflated site remains level 0 Problem: Elimination: Goal: Will not experience complications related to urinary retention Outcome: Progressing  Pt voids without complication Problem: Cardiovascular: Goal: Ability to achieve and maintain adequate cardiovascular perfusion will improve Outcome: Progressing  Cardiovascular status remains stable. Pt with out chest pain

## 2018-03-23 NOTE — ED Notes (Signed)
Called and spoke with Elenore Rota (pts boyfriend), and Marquita (pts daughter) at pts request.  State that they will meet the pt at the hospital.   Pt resting, calmer at this time.  Denies pain. Denies SOB. Denies nausea. No diaphoresis.

## 2018-03-23 NOTE — ED Notes (Signed)
Spoke with [place], Agricultural consultant at [employer].  Report given.

## 2018-03-23 NOTE — Care Management (Signed)
Brilinta benefits check sent and pending. CM team will follow to discuss est monthly copay amount and provide a Brilinta card once cost has been determined.   Nyjah Schwake Y. Jiyan Walkowski BSN, NCM-BC, ACM-RN  

## 2018-03-23 NOTE — Care Management (Signed)
Per [facility]  co-pay amount for Brilinta 90 mg. 2 times a day $85.00. Generic Brand (Clopidogrel) 90 mg.2 times a day $15.00 for 30 day supply.  Mail order price for Brilinta  90 day supply $250.00 No Deductible,No PA required.Pt. has a $5000 out of pocket not met. Pharmacy : [facility] REF# [id number]

## 2018-03-23 NOTE — H&P (Signed)
Cardiology History & Physical    Patient ID: [name] MRN: [id number], DOB: 09/26/1965 Date of Encounter: 03/23/2018, 12:48 AM Primary Physician: Patient, No Pcp Per  Chief Complaint: Epigastric pain   HPI: Brooke Williamson is a 53 y.o. female with history of HTN, HLD, GERD, PUD, active smoker, who presented with acute epigastric pain this evening.  She was at rest when the pain began, and at it's worst, it was 8/10 in intensity.  Due to ongoing discomfort she presented to [facility] for evaluation.  There, her initial ECG was unremarkable, but repeat ECGs showed  profound ST elevation in the inferior leads and V5-V6, along with ST depression in V1-V2.  A code STEMI was activated, and she was transported directly to the [facility] cath lab.  She received full dose ASA and IV fentanyl with some improvement in her pain,but still had ongoing symptoms upon her arrival. The patient remained hemodynamically and electrically stable throughout transport.  Past Medical History:  Diagnosis Date  . GERD (gastroesophageal reflux disease)   . Hypercholesteremia   . Hypertension   . Thyroid disease   . Ulcer      Surgical History:  Past Surgical History:  Procedure Laterality Date  . THYROID SURGERY       Home Meds: Prior to Admission medications   Medication Sig Start Date End Date Taking? Authorizing Provider  cyclobenzaprine (FLEXERIL) 10 MG tablet Take 1 tablet (10 mg total) by mouth 2 (two) times daily as needed for up to 12 doses for muscle spasms. 12/01/17   Lennice Sites, DO  levothyroxine (SYNTHROID, LEVOTHROID) 88 MCG tablet  11/22/17   [provider]  Saccharomyces boulardii (PROBIOTIC) 250 MG CAPS Take 1 capsule by mouth daily. 09/17/17   Law, Bea Graff, PA-C  spironolactone-hydrochlorothiazide (ALDACTAZIDE) 25-25 MG per tablet Take 1 tablet by mouth daily.    [provider]    Allergies:  Allergies  Allergen Reactions  .  Nitrofurantoin Monohyd Macro Shortness Of Breath    rash  . Ciprofloxacin   . Penicillins Rash    Social History   Socioeconomic History  . Marital status: Single    Spouse name: Not on file  . Number of children: Not on file  . Years of education: Not on file  . Highest education level: Not on file  Occupational History  . Not on file  Social Needs  . Financial resource strain: Not on file  . Food insecurity:    Worry: Not on file    Inability: Not on file  . Transportation needs:    Medical: Not on file    Non-medical: Not on file  Tobacco Use  . Smoking status: Current Every Day Smoker    Types: Cigarettes  . Smokeless tobacco: Never Used  Substance and Sexual Activity  . Alcohol use: Not Currently    Comment: occ  . Drug use: No  . Sexual activity: Not on file  Lifestyle  . Physical activity:    Days per week: Not on file    Minutes per session: Not on file  . Stress: Not on file  Relationships  . Social connections:    Talks on phone: Not on file    Gets together: Not on file    Attends religious service: Not on file    Active member of club or organization: Not on file    Attends meetings of clubs or organizations: Not on file    Relationship status: Not  on file  . Intimate partner violence:    Fear of current or ex partner: Not on file    Emotionally abused: Not on file    Physically abused: Not on file    Forced sexual activity: Not on file  Other Topics Concern  . Not on file  Social History Narrative  . Not on file     No family history on file.  Review of Systems: All other systems reviewed and are otherwise negative except as noted above.  Labs:   Lab Results  Component Value Date   WBC 8.6 03/22/2018   HGB 13.7 03/22/2018   HCT 42.4 03/22/2018   MCV 88.1 03/22/2018   PLT 325 03/22/2018    Recent Labs  Lab 03/22/18 2321  NA 135  K 3.1*  CL 98  CO2 25  BUN 22*  CREATININE 1.35*  CALCIUM 9.5  GLUCOSE 148*   Recent Labs     03/22/18 2321  TROPONINI <0.03   No results found for: CHOL, HDL, LDLCALC, TRIG No results found for: DDIMER  Radiology/Studies:  Dg Chest 2 View  Result Date: 03/22/2018 CLINICAL DATA:  Chest pain EXAM: CHEST - 2 VIEW COMPARISON:  04/29/2016 FINDINGS: The heart size and mediastinal contours are within normal limits. Both lungs are clear. The visualized skeletal structures are unremarkable. Postsurgical changes at the thoracic inlet. IMPRESSION: No active cardiopulmonary disease. Electronically Signed   By: Donavan Foil M.D.   On: 03/22/2018 23:37   Wt Readings from Last 3 Encounters:  03/22/18 64.9 kg  12/01/17 61.2 kg  09/17/17 63.6 kg    EKG: NSR, STE in inferior, anterolateral leads. ST depression in V1/V2.  Physical Exam: Blood pressure 121/82, pulse (!) 102, temperature 98.1 F (36.7 C), temperature source Oral, resp. rate 19, height 5' (1.524 m), weight 64.9 kg, last menstrual period 03/29/2016, SpO2 100 %. Body mass index is 27.93 kg/m. General: Well developed, well nourished, in no acute distress. Head: Normocephalic, atraumatic, sclera non-icteric, no xanthomas, nares are without discharge.  Neck: Negative for carotid bruits. JVD not elevated. Lungs: Clear bilaterally to auscultation without wheezes, rales, or rhonchi. Breathing is unlabored. Heart: RRR with S1 S2. No murmurs, rubs, or gallops appreciated. Abdomen: Soft, non-tender, non-distended with normoactive bowel sounds. No hepatomegaly. No rebound/guarding. No obvious abdominal masses. Msk:  Strength and tone appear normal for age. Extremities: No clubbing or cyanosis. No edema.  Distal pedal pulses are 2+ and equal bilaterally. Neuro: Alert and oriented X 3. No focal deficit. No facial asymmetry. Moves all extremities spontaneously. Psych:  Responds to questions appropriately with a normal affect.    Assessment and Plan  59F with acute inferior STEMI.  Plan to take directly to the catheterization lab for  emergent coronary angiography and possible PCI.   Signed, Doylene Canning, MD 03/23/2018, 12:48 AM

## 2018-03-23 NOTE — Progress Notes (Signed)
  Echocardiogram 2D Echocardiogram has been performed.  Matilde Bash 03/23/2018, 8:15 AM

## 2018-03-23 NOTE — Progress Notes (Signed)
Interventional note:  The patient is admitted early this morning with an inferior STEMI.  She was noted to have total occlusion of a large obtuse marginal branch treated with PCI.  She is to be continued on tirofiban for 18 hours with residual thrombus burden.  The patient is hemodynamically stable with mild ongoing chest discomfort, greatly improved from presentation.  She is on aspirin and ticagrelor as well as a high intensity statin drug with atorvastatin 80 mg.  Will add metoprolol 12.5 mg twice daily.  Review of telemetry demonstrates sinus rhythm with periods of nonsustained VT.  2D echo has been completed with the interpretation currently pending.  Disposition: Keep in the CCU today, anticipate transfer to telemetry tomorrow and potential discharge home on Sunday.  Sherren Mocha 03/23/2018 8:43 AM

## 2018-03-23 NOTE — Progress Notes (Signed)
Interventional STEMI H&P   The patient was met in the emergency room and brought straight to the cardiac Cath Lab.  She is 53 year old African-American female with no prior history of vascular disease.  Risk factors include smoking and hypertension.  She works 2 jobs and has not been limited by chest discomfort or dyspnea.  She does not use illicit substances.  Denies diabetes, hyperlipidemia, and kidney impairment.  Endorses compliance with medication.  At around 9 PM she developed significant epigastric discomfort that led to the emergency room.  Initial EKG revealed sinus tachycardia with nonspecific ST changes.  Repeat EKG with significant increase in epigastric discomfort reveal widespread ST elevation in II, III, aVF, V4 and V5.  STEMI activation occurred.  She received aspirin, 3900 units of IV heparin, and oxygen was started.  Zofran was given for nausea.  On examination the patient is apprehensive, able to lie flat, denies shortness of breath, has normal O2 saturations.  Carotid, radial, and dorsalis pedis pulses are 2+ and symmetric.  Cardiac exam reveals an S4 gallop but no murmur.  Lungs are clear.  Skin is warm and dry.  Blood pressure 120/75 mmHg.  Upon arrival in the Cath Lab, pain was rated as a 6 out of 10 compared to initial 8 out of 10 at its most severe intensity.  ST elevation was still noted on monitor.  IMPRESSION: Acute inferoapical/lateral infarction with stable hemodynamics and ongoing chest pain  PLAN: Emergency coronary angiography discussed with the patient.  Mechanical reperfusion if indicated also discussed with the patient.  Guideline based therapy thereafter.  Critical care time 31 minutes.

## 2018-03-23 NOTE — Progress Notes (Signed)
1320-1350 Pt sleeping on and off.  Attempted to begin MI ed with pt and daughters but pt drifting off to sleep. Reviewed importance of brilinta with stent. Discussed MI restrictions, and wrist restriction. Left heart healthy diet for pt and daughters were given ones too at their request. Will return tomorrow and continue ed and ambulate. Graylon Good RN BSN 03/23/2018 1:49 PM

## 2018-03-23 NOTE — CV Procedure (Signed)
   Acute inferolateral ST elevation myocardial infarction  Right radial access achieved with real-time vascular ultrasound  Total occlusion of large branching first obtuse marginal successfully treated with PCI and stenting to 0% stenosis and TIMI grade III flow.  Heavy thrombus burden in the vessel and distal embolization noted.  6 out of 10 pain on arrival reduced to 1 out of 10 post PCI.  Widely patent left main, LAD, circumflex other than the first obtuse marginal which was the culprit, and right coronary.  LVEF 35 to 40% with upper normal LVEDP of 17 mmHg.  No immediate complications.  Aggrastat for 18 hours given thrombus burden and persistent ST elevation post procedure.

## 2018-03-24 DIAGNOSIS — I2119 ST elevation (STEMI) myocardial infarction involving other coronary artery of inferior wall: Principal | ICD-10-CM

## 2018-03-24 LAB — BASIC METABOLIC PANEL
Anion gap: 8 (ref 5–15)
BUN: 9 mg/dL (ref 6–20)
CO2: 22 mmol/L (ref 22–32)
Calcium: 8.4 mg/dL — ABNORMAL LOW (ref 8.9–10.3)
Chloride: 103 mmol/L (ref 98–111)
Creatinine, Ser: 0.92 mg/dL (ref 0.44–1.00)
GFR calc Af Amer: >60 mL/min (ref >60)
GFR calc non Af Amer: >60 mL/min (ref >60)
GLUCOSE: 156 mg/dL — AB (ref 70–99)
Potassium: 3.9 mmol/L (ref 3.5–5.1)
Sodium: 133 mmol/L — ABNORMAL LOW (ref 135–145)

## 2018-03-24 MED ORDER — SALINE SPRAY 0.65 % NA SOLN
1.0000 | NASAL | Status: DC | PRN
  Administered 2018-03-24: 1 via NASAL
  Filled 2018-03-24: qty 44

## 2018-03-24 MED ORDER — NICOTINE 14 MG/24HR TD PT24
14.0000 mg | MEDICATED_PATCH | Freq: Every day | TRANSDERMAL | Status: DC
  Administered 2018-03-24 – 2018-03-25 (×2): 14 mg via TRANSDERMAL
  Filled 2018-03-24 (×2): qty 1

## 2018-03-24 MED ORDER — CARVEDILOL 3.125 MG PO TABS
3.1250 mg | ORAL_TABLET | Freq: Two times a day (BID) | ORAL | Status: DC
  Administered 2018-03-24 – 2018-03-25 (×2): 3.125 mg via ORAL
  Filled 2018-03-24 (×2): qty 1

## 2018-03-24 MED ORDER — LOSARTAN POTASSIUM 25 MG PO TABS
12.5000 mg | ORAL_TABLET | Freq: Every day | ORAL | Status: DC
  Administered 2018-03-24 – 2018-03-25 (×2): 12.5 mg via ORAL
  Filled 2018-03-24 (×3): qty 1

## 2018-03-24 MED ORDER — SPIRONOLACTONE 12.5 MG HALF TABLET
12.5000 mg | ORAL_TABLET | Freq: Every day | ORAL | Status: DC
  Administered 2018-03-24 – 2018-03-25 (×2): 12.5 mg via ORAL
  Filled 2018-03-24 (×2): qty 1

## 2018-03-24 NOTE — Plan of Care (Signed)

## 2018-03-24 NOTE — Progress Notes (Signed)
CARDIOLOGY ROUNDING NOTE Subjective:   Feels better. No CP. Now off aggrestat. No bleeding.   Echo reviewed personally 40-45% Inferolat HK. No MR.   Intake/Output Summary (Last 24 hours) at 03/24/2018 0939 Last data filed at 03/24/2018 0600 Gross per 24 hour  Intake 371.32 ml  Output 1550 ml  Net -1178.68 ml    Current meds: . aspirin  81 mg Oral Daily  . atorvastatin  80 mg Oral q1800  . heparin  60 Units/kg Intravenous Once  . heparin  5,000 Units Subcutaneous Q8H  . metoprolol tartrate  12.5 mg Oral BID  . sodium chloride flush  3 mL Intravenous Q12H  . ticagrelor  90 mg Oral BID   Infusions: . sodium chloride    . sodium chloride    . sodium chloride       Objective:  Blood pressure 113/72, pulse (!) 120, temperature 99.1 F (37.3 C), temperature source Oral, resp. rate 15, height 5' (1.524 m), weight 64.9 kg, last menstrual period 03/29/2016, SpO2 94 %. Weight change:   Physical Exam: General:  Well appearing. No resp difficulty HEENT: normal Neck: supple. JVP . Carotids 2+ bilat; no bruits. No lymphadenopathy or thryomegaly appreciated. Cor: PMI nondisplaced. Regular. Mildly tachy  No rubs, gallops or murmurs. Lungs: clear Abdomen: soft, nontender, nondistended. No hepatosplenomegaly. No bruits or masses. Good bowel sounds. Extremities: no cyanosis, clubbing, rash, edema Neuro: alert & orientedx3, cranial nerves grossly intact. moves all 4 extremities w/o difficulty. Affect pleasant  Telemetry: Sinus/sinus tach 90-110  9-beat run NSVT early this am Personally reviewed   Lab Results: Basic Metabolic Panel: Recent Labs  Lab 03/22/18 2321 03/23/18 0443 03/23/18 1440 03/24/18 0348  NA 135 134* 133* 133*  K 3.1* 4.3 4.2 3.9  CL 98 101 103 103  CO2 25 21* 21* 22  GLUCOSE 148* 170* 160* 156*  BUN 22* 15 12 9   CREATININE 1.35* 1.13* 1.01* 0.92  CALCIUM 9.5 8.8* 8.8* 8.4*   Liver Function Tests: No results for input(s): AST, ALT, ALKPHOS, BILITOT, PROT,  ALBUMIN in the last 168 hours. No results for input(s): LIPASE, AMYLASE in the last 168 hours. No results for input(s): AMMONIA in the last 168 hours. CBC: Recent Labs  Lab 03/22/18 2321 03/23/18 0443 03/23/18 0920  WBC 8.6 13.9*  --   HGB 13.7 12.0  --   HCT 42.4 37.7  --   MCV 88.1 86.1  --   PLT 325 280 267   Cardiac Enzymes: Recent Labs  Lab 03/22/18 2321 03/23/18 0443 03/23/18 0920 03/23/18 1440  TROPONINI <0.03 >65.00* >65.00* >65.00*   BNP: Invalid input(s): POCBNP CBG: Recent Labs  Lab 03/23/18 1116  GLUCAP 131*   Microbiology: Lab Results  Component Value Date   CULT ESCHERICHIA COLI 10/08/2010   CULT ESCHERICHIA COLI 09/24/2010   CULT ESCHERICHIA COLI 08/24/2010   No results for input(s): CULT, SDES in the last 168 hours.  Imaging: Dg Chest 2 View  Result Date: 03/22/2018 CLINICAL DATA:  Chest pain EXAM: CHEST - 2 VIEW COMPARISON:  04/29/2016 FINDINGS: The heart size and mediastinal contours are within normal limits. Both lungs are clear. The visualized skeletal structures are unremarkable. Postsurgical changes at the thoracic inlet. IMPRESSION: No active cardiopulmonary disease. Electronically Signed   By: Donavan Foil M.D.   On: 03/22/2018 23:37   Left Ventricle The left ventricular size is normal. There is mild to moderate left ventricular systolic dysfunction. LV end diastolic pressure is mildly elevated. The left ventricular ejection fraction  is 35-45% by visual estimate.  Coronary Diagrams   Diagnostic  Dominance: Right       ASSESSMENT:    53 y/o woman with HTN and tobacco use admitted 1/10 with inferior STEMI.  She was noted to have total occlusion of a large obtuse marginal branch treated with PCI.  She is to be continued on tirofiban for 18 hours with residual thrombus burden.   PLAN/DISCUSSION:  1. CAD with inferolateral STEMI  - cath with single vessel disease with acute occlusion of large ramus s/p PCI. Treated with aggrestat  post-PCI for large thrombus burden. Now off - troponin > 65% - now CP free - on ASA, Brllinta, statin and b-blocker - consult CR  2. Ischemic CM - EF 35-45% by cath - 40-45% by echo no evidence of ischemic MR - change lopressor to carvedilol, Resume spiro. Add low-dose losartan  3. NSVT - continue b-blocker - Keep K > 4.0 Mg >2.0  4. HTN - currently well controlled. Med changes as above  5. Tobacco use - counseled on need for cessation  6. Hyperglycemia - check hgba1c - if elevated consider +/- SGLT2i  7. Hypokalemia - Improved Add spiro  8. Sinus tachycardia - no evidence low output. On b-blocker - ? Nicotine WD contributing. Add nicotine patch - check TSH  9. Dispo - transfer to tele   LOS: 1 day    Glori Bickers, MD 03/24/2018, 9:39 AM

## 2018-03-24 NOTE — Progress Notes (Signed)
CARDIAC REHAB PHASE I   PRE:  Rate/Rhythm: 107 ST  BP:  Supine:   Sitting: 118/84  Standing:    SaO2: 97% RA  MODE:  Ambulation: 400 ft   POST:  Rate/Rhythm:   BP:  Supine:   Sitting: 124/88  Standing:    SaO2: 98% RA Tolerated ambulation well without c/p or complaints.  Education completed re: MI, activity restrictions, NTG usage, when to call 911 or MD.  Discussed stent card, and antiplatelet therapy, exercise progression, and heart healthy diet.  Referred to [facility] post discharge.  Liliane Channel RN, BSN 03/24/2018 [phone]

## 2018-03-25 ENCOUNTER — Telehealth: Payer: Self-pay | Admitting: Physician Assistant

## 2018-03-25 ENCOUNTER — Encounter (HOSPITAL_COMMUNITY): Payer: Self-pay | Admitting: Physician Assistant

## 2018-03-25 DIAGNOSIS — I472 Ventricular tachycardia: Secondary | ICD-10-CM

## 2018-03-25 DIAGNOSIS — I255 Ischemic cardiomyopathy: Secondary | ICD-10-CM

## 2018-03-25 DIAGNOSIS — I2510 Atherosclerotic heart disease of native coronary artery without angina pectoris: Secondary | ICD-10-CM

## 2018-03-25 DIAGNOSIS — R7989 Other specified abnormal findings of blood chemistry: Secondary | ICD-10-CM

## 2018-03-25 DIAGNOSIS — I4729 Other ventricular tachycardia: Secondary | ICD-10-CM

## 2018-03-25 DIAGNOSIS — E876 Hypokalemia: Secondary | ICD-10-CM

## 2018-03-25 DIAGNOSIS — Z720 Tobacco use: Secondary | ICD-10-CM

## 2018-03-25 DIAGNOSIS — R000 Tachycardia, unspecified: Secondary | ICD-10-CM

## 2018-03-25 DIAGNOSIS — E119 Type 2 diabetes mellitus without complications: Secondary | ICD-10-CM

## 2018-03-25 LAB — HEMOGLOBIN A1C
Hgb A1c MFr Bld: 7 % — ABNORMAL HIGH (ref 4.8–5.6)
Mean Plasma Glucose: 154.2 mg/dL

## 2018-03-25 LAB — HEPATIC FUNCTION PANEL
ALT: 47 U/L — ABNORMAL HIGH (ref 0–44)
AST: 165 U/L — ABNORMAL HIGH (ref 15–41)
Albumin: 3.3 g/dL — ABNORMAL LOW (ref 3.5–5.0)
Alkaline Phosphatase: 41 U/L (ref 38–126)
Bilirubin, Direct: <0.1 mg/dL (ref 0.0–0.2)
Total Bilirubin: 0.5 mg/dL (ref 0.3–1.2)
Total Protein: 6.7 g/dL (ref 6.5–8.1)

## 2018-03-25 LAB — T4, FREE: Free T4: 0.82 ng/dL (ref 0.82–1.77)

## 2018-03-25 LAB — TSH: TSH: 5.968 u[IU]/mL — ABNORMAL HIGH (ref 0.350–4.500)

## 2018-03-25 LAB — MAGNESIUM: Magnesium: 2.1 mg/dL (ref 1.7–2.4)

## 2018-03-25 MED ORDER — SPIRONOLACTONE 25 MG PO TABS: 12.5000 mg | ORAL_TABLET | Freq: Every day | ORAL | 6 refills | Status: DC

## 2018-03-25 MED ORDER — CARVEDILOL 3.125 MG PO TABS: 3.1250 mg | ORAL_TABLET | Freq: Two times a day (BID) | ORAL | 6 refills | Status: DC

## 2018-03-25 MED ORDER — ATORVASTATIN CALCIUM 80 MG PO TABS: 80.0000 mg | ORAL_TABLET | Freq: Every evening | ORAL | 6 refills | Status: DC

## 2018-03-25 MED ORDER — LOSARTAN POTASSIUM 25 MG PO TABS: 12.5000 mg | ORAL_TABLET | Freq: Every day | ORAL | 6 refills | Status: DC

## 2018-03-25 MED ORDER — ASPIRIN 81 MG PO TBEC: 81.0000 mg | DELAYED_RELEASE_TABLET | Freq: Every day | ORAL | 11 refills | Status: DC

## 2018-03-25 MED ORDER — NICOTINE 14 MG/24HR TD PT24: 14.0000 mg | MEDICATED_PATCH | Freq: Every day | TRANSDERMAL | 0 refills | Status: DC

## 2018-03-25 MED ORDER — METFORMIN HCL 500 MG PO TABS: 500.0000 mg | ORAL_TABLET | Freq: Two times a day (BID) | ORAL | 1 refills | Status: DC

## 2018-03-25 MED ORDER — METFORMIN HCL 500 MG PO TABS: 250.0000 mg | ORAL_TABLET | Freq: Two times a day (BID) | ORAL | 1 refills | Status: DC

## 2018-03-25 MED ORDER — TICAGRELOR 90 MG PO TABS: 90.0000 mg | ORAL_TABLET | Freq: Two times a day (BID) | ORAL | 11 refills | Status: DC

## 2018-03-25 MED ORDER — ASPIRIN EC 81 MG PO TBEC: 81.0000 mg | DELAYED_RELEASE_TABLET | Freq: Every day | ORAL | Status: DC

## 2018-03-25 MED ORDER — NITROGLYCERIN 0.4 MG SL SUBL: 0.4000 mg | SUBLINGUAL_TABLET | SUBLINGUAL | 3 refills | Status: DC | PRN

## 2018-03-25 MED ORDER — LIVING BETTER WITH HEART FAILURE BOOK: Freq: Once | Status: DC

## 2018-03-25 NOTE — Telephone Encounter (Signed)
Patient discharged today. LFTs were still pending. Follow-up LFTs were noted to be elevated in the hospital, AST 165, ALT 47 - suspect due to acute MI given degree of troponin elevation. This note is to signify recommendation to recheck LFTs at time of follow-up appointment. I have already put in request for office to call patient with TOC but she should also discuss rechecking liver function at that visit. Can you please forward this note to provider who will be seeing her once appointment is made to give them a heads up? Thanks.  Dayna Dunn PA-C

## 2018-03-25 NOTE — Care Management (Signed)
Patient provided with Brilinta card

## 2018-03-25 NOTE — Discharge Summary (Signed)
Discharge Summary    Patient ID: Brooke Williamson,  MRN: [id number], DOB/AGE: November 25, 1965 53 y.o.  Admit date: 03/22/2018 Discharge date: 03/25/2018  Primary Care Provider: Patient, No Pcp Per Primary Cardiologist: Sinclair Grooms, MD Primary Electrophysiologist:  None  Discharge Diagnoses    Principal Problem:   Acute ST elevation myocardial infarction (STEMI) of inferior wall [facility]) Active Problems:   CAD in native artery   Hypertension   Ischemic cardiomyopathy   Abnormal TSH   Sinus tachycardia   Hypokalemia   NSVT (nonsustained ventricular tachycardia) ([place])   Tobacco abuse   Diabetes mellitus type 2 in nonobese [facility])    Diagnostic Studies/Procedures    2d echo 03/23/18 Study Conclusions  - Left ventricle: The cavity size was normal. Systolic function was   mildly to moderately reduced. The estimated ejection fraction was   in the range of 40% to 45%. Hypokinesis of the   basal-midanterolateral and inferolateral myocardium; consistent   with ischemia or infarction in the distribution of the left   circumflex coronary artery. Doppler parameters are consistent   with abnormal left ventricular relaxation (grade 1 diastolic   dysfunction).  2d echo 03/23/2018 Conclusion    Acute inferolateral ST elevation myocardial infarction due to occlusion of a large branching first obtuse marginal.  Successful PCI and stent implantation in the proximal to mid first obtuse marginal reducing a 100% stenosis with TIMI grade 0 flow to TIMI grade III and 0% stenosis.  The lesion substrate is a [place] 1, 0, 0 bifurcation with a single stent crossing over the high lateral side branch.  Final balloon diameter 3 mm.  Distal embolization of thrombus is noted into the inferior most branch of the first obtuse marginal.  The circumflex proximal to and beyond the first obtuse marginal is normal.  Left main is normal  Left anterior descending wraps around the left ventricular apex and is  normal.  Right coronary is dominant and normal.  Left ventricular function is reduced and estimated to be 35 to 45%.  LVEDP 17 mmHg.  Inferior and distal anterior wall hypokinesis is noted.  RECOMMENDATIONS:   Aspirin and Brilinta x12 months.  Brilinta could eventually be transitioned to Plavix for cost purposes.  Smoking cessation  Aggressive risk factor modification also to should include blood pressure control and LDL lowering to less than 50.  Consider cardiac rehab  Discharge based upon clinical progress over the next 36 to 48 hours.      _____________     History of Present Illness     Brooke Williamson is a 53 y.o. female with history of HTN, HLD, tobacco abuse, GERD, PUD, thyroid disease who presented to [facility] with acute epigastric pain. She was at rest when the pain began.  Due to ongoing discomfort she presented to [facility] for evaluation. There, her initial ECG was unremarkable, but repeat ECGs showed  profound ST elevation in the inferior leads and V5-V6, along with ST depression in V1-V2.  A code STEMI was activated, and she was transported directly to the [facility] cath lab.  She received full dose ASA and IV fentanyl with some improvement in her pain, but still had ongoing symptoms upon her arrival.    Hospital Course    1. Inferior STEMI/CAD - emergent cath showed occlusion of large branching first obtuse marginal. She underwent Successful PCI and stent implantation in the proximal to mid first obtuse marginal. Distal embolization of thrombus was noted into the  inferior most branch of the first obtuse marginal. LM, LAD, and RCA were normal. LVEF was 35-45% with LVEDP 17. She was started on ASA and Brilinta with recommendation to continue DAPT for 12 months. Per cath note, Brilinta could eventually be transitioned to Plavix for cost purposes. She received 30 day free Brilinta card at DC. Care management evaluated cost of copay which is  $85. This will need to be reviewed with patient upon follow-up. She was also started on ASA, statin, BB, ARB, and spironolactone. Smoking cessation was advised.   2. Ischemic cardiomyopathy - EF 35-45% as noted above. 2D echo 03/23/18 showed EF 40-45%, Hypokinesis of the basal- midanterolateral and inferolateral myocardium; consistent with ischemia or infarction in the distribution of the left circumflex coronary artery. See above for meds added. Reviewed 2g sodium restriction, 2L fluid restriction, daily weights with patient. Rx'd CHF booklet for education. Felt to be euvolemic on discharge with weight 64.9kg. Will need BMET at time of f/u.  3. NSVT - noted on telemetry - beta blocker started. She has not had any in the last 24 hours. Electrolytes were stable at discharge.  4. Hyperlipidemia - LDL noted to be 152. LFTs were sent at time of discharge and will need reviewing upon follow-up. If the patient is tolerating statin at time of follow-up appointment, would consider rechecking liver function/lipid panel in 6-8 weeks. Of note, atorvastatin is listed on med list as previously causing chest pain (interesting) but she tolerated high dose in the hospital.  5. Essential HTN - softer BP noted this admission, prohibiting more aggressive med titration. Will need to follow as outpatient.  6. Tobacco abuse - patient advised to discontinue smoking. She was started on 14mg  patch as inpatient - will prescribe this at discharge per her request x 6 weeks. If patient is tolerating this on follow-up, will need prescription to decrease dose to 7mg  after 6 week timeframe.  7. Newly recognized diabetes - patient did recall possibly being on metformin in the past but was taken off due to controlled sugars. However, A1C noted to be 7.0. Dr. Percival Spanish recommended initiation of metformin 250mg  BID with close OP follow-up. She can start this tomorrow. Was also given handwritten rx for glucometer, test strips and lancets.    8. Hypokalemia - repleted, resolved.   8. Sinus tachycardia - may be sequelae of recent MI. TSH mildly elevated but free T4 normal. Free T3 still in process. Will need to f/u PCP for thyroid function.  9. Abnormal TSH - TSH 5.968 but free hormones normal. Pt advised to f/u PCP to discuss.  The patient feels well today and wants to go home. She will be staying with her daughter while she recovers. She was given standard post STEMI instructions including no work until discussions are had as outpatient. Dr. Percival Spanish has seen and examined the patient today and feels she is stable for discharge. Note from APP earlier today indicates patient wants to use [facility] pharmacy but program is not available on weekend. Meds sent to patient's usual pharmacy as requested. The patient reports she was previously prescribed Metronidazole and Bactrim a few days prior to admission for bladder infection (symptom was frequent urination) - these made her stomach hurt and she felt sick with them so she stopped them. She has not been prescribed them while inpatient. Dr. Percival Spanish recommends she discuss with primary care as outpatient. She denied any further sx of UTI while inpatient.  Meds were originally sent to the pharmacy listed in  Pt's chart on [place]. However, she prefers these be routed to [facility] so I did so, and called [facility] to ask them not to fill the scripts. _____________  Discharge Vitals Blood pressure 99/70, pulse (!) 103, temperature 98.7 F (37.1 C), temperature source Oral, resp. rate (!) 21, height 5' (1.524 m), weight 64.9 kg, last menstrual period 03/29/2016, SpO2 96 %.  Filed Weights   03/22/18 2315 03/25/18 0446  Weight: 64.9 kg 64.9 kg    Labs & Radiologic Studies    CBC Recent Labs    03/22/18 2321 03/23/18 0443 03/23/18 0920  WBC 8.6 13.9*  --   HGB 13.7 12.0  --   HCT 42.4 37.7  --   MCV 88.1 86.1  --   PLT 325 280 761   Basic Metabolic Panel Recent Labs     03/23/18 1440 03/24/18 0348 03/25/18 0411  NA 133* 133*  --   K 4.2 3.9  --   CL 103 103  --   CO2 21* 22  --   GLUCOSE 160* 156*  --   BUN 12 9  --   CREATININE 1.01* 0.92  --   CALCIUM 8.8* 8.4*  --   MG  --   --  2.1   Liver Function Tests No results for input(s): AST, ALT, ALKPHOS, BILITOT, PROT, ALBUMIN in the last 72 hours. No results for input(s): LIPASE, AMYLASE in the last 72 hours. Cardiac Enzymes Recent Labs    03/23/18 0443 03/23/18 0920 03/23/18 1440  TROPONINI >65.00* >65.00* >65.00*   BNP Invalid input(s): POCBNP D-Dimer No results for input(s): DDIMER in the last 72 hours. Hemoglobin A1C Recent Labs    03/25/18 0411  HGBA1C 7.0*   Fasting Lipid Panel Recent Labs    03/23/18 0443  CHOL 212*  HDL 54  LDLCALC 152*  TRIG 29  CHOLHDL 3.9   Thyroid Function Tests Recent Labs    03/25/18 0411  TSH 5.968*   _____________  Dg Chest 2 View  Result Date: 03/22/2018 CLINICAL DATA:  Chest pain EXAM: CHEST - 2 VIEW COMPARISON:  04/29/2016 FINDINGS: The heart size and mediastinal contours are within normal limits. Both lungs are clear. The visualized skeletal structures are unremarkable. Postsurgical changes at the thoracic inlet. IMPRESSION: No active cardiopulmonary disease. Electronically Signed   By: Donavan Foil M.D.   On: 03/22/2018 23:37   Disposition   Pt is being discharged home today in good condition.  Follow-up Plans & Appointments    Follow-up Information    Belva Crome, MD Follow up.   Specialty:  Cardiology Why:  Our office will call you for a follow-up appointment. Please call the office if you have not heard from [place] within 3 days.  Contact information: [address] [place] [address] [phone]        Primary Care Provider Follow up in 1 week(s).          Discharge Instructions    Amb Referral to Cardiac Rehabilitation   Complete by:  As directed    Diagnosis:   STEMI Coronary Stents      Diet - low sodium heart healthy   Complete by:  As directed    Discharge instructions   Complete by:  As directed    Your labs showed you are diabetic. You need to see your primary care provider in 1 week to discuss further management. Please check your blood sugar twice a day and keep a  log. This should be once in the morning before breakfast, then once after dinner or before bed. Bring the log to your primary care visit.   Please review medicine list carefully as there were several new medicines and changes compared to prior list.  Your probiotic and cyclobenzaprine were removed from your list because you indicated you were not taking them.  Your thyroid function was also slightly abnormal and requires repeat testing within the next few weeks - you should also discuss this with your primary care provider.  Please call them tomorrow to discuss plan for bladder infection.  One of your heart tests showed weakness of the heart muscle this admission. This may make you more susceptible to weight gain from fluid retention, which can lead to symptoms that we call heart failure. Please follow these special instructions:  1. Follow a low-salt diet - you are allowed no more than 2,000mg  of sodium per day. Watch your fluid intake. In general, you should not be taking in more than 2 liters of fluid per day (no more than 8 glasses per day). This includes sources of water in foods like soup, coffee, tea, milk, etc. 2. Weigh yourself on the same scale at same time of day and keep a log. 3. Call your doctor: (Anytime you feel any of the following symptoms)  - 3lb weight gain overnight or 5lb within a few days - Shortness of breath, with or without a dry hacking cough  - Swelling in the hands, feet or stomach  - If you have to sleep on extra pillows at night in order to breathe   IT IS IMPORTANT TO LET YOUR DOCTOR KNOW EARLY ON IF YOU ARE HAVING SYMPTOMS SO WE CAN HELP YOU!   Increase activity slowly    Complete by:  As directed    No driving for 2 weeks. No lifting over 10 lbs for 4 weeks. No sexual activity for 4 weeks. You may not return to work until cleared by your cardiologist. Keep procedure site clean & dry. If you notice increased pain, swelling, bleeding or pus, call/return!  You may shower, but no soaking baths/hot tubs/pools for 1 week.      Discharge Medications   Allergies as of 03/25/2018      Reactions   Nitrofurantoin Monohyd Macro Shortness Of Breath   rash   Atorvastatin Other (See Comments)   CHEST PAIN CHEST PAIN   Simvastatin Other (See Comments)   CHEST PAIN CHEST PAIN   Ciprofloxacin    Penicillins Rash      Medication List    STOP taking these medications   cyclobenzaprine 10 MG tablet Commonly known as:  FLEXERIL   Probiotic 250 MG Caps   spironolactone-hydrochlorothiazide 25-25 MG tablet Commonly known as:  ALDACTAZIDE     TAKE these medications   aspirin 81 MG EC tablet Take 1 tablet (81 mg total) by mouth daily. Start taking on:  March 26, 2018   atorvastatin 80 MG tablet Commonly known as:  LIPITOR Take 1 tablet (80 mg total) by mouth every evening. What changed:    medication strength  how much to take  when to take this   carvedilol 3.125 MG tablet Commonly known as:  COREG Take 1 tablet (3.125 mg total) by mouth 2 (two) times daily with a meal.   levothyroxine 88 MCG tablet Commonly known as:  SYNTHROID, LEVOTHROID Take 88 mcg by mouth daily before breakfast.   losartan 25 MG tablet Commonly known as:  COZAAR Take 0.5 tablets (12.5 mg total) by mouth daily. Start taking on:  March 26, 2018   metFORMIN 500 MG tablet Commonly known as:  GLUCOPHAGE Take 0.5 tablets (250 mg total) by mouth 2 (two) times daily with a meal. Start taking on:  March 26, 2018   nicotine 14 mg/24hr patch Commonly known as:  NICODERM CQ - dosed in mg/24 hours Place 1 patch (14 mg total) onto the skin daily. Start taking on:  March 26, 2018   nitroGLYCERIN 0.4 MG SL tablet Commonly known as:  NITROSTAT Place 1 tablet (0.4 mg total) under the tongue every 5 (five) minutes as needed for chest pain (Up to 3 doses. If taking 3rd dose call 911).   spironolactone 25 MG tablet Commonly known as:  ALDACTONE Take 0.5 tablets (12.5 mg total) by mouth daily. Start taking on:  March 26, 2018   ticagrelor 90 MG Tabs tablet Commonly known as:  BRILINTA Take 1 tablet (90 mg total) by mouth 2 (two) times daily.        Allergies:  Allergies  Allergen Reactions  . Nitrofurantoin Monohyd Macro Shortness Of Breath    rash  . Atorvastatin Other (See Comments)    CHEST PAIN CHEST PAIN   . Simvastatin Other (See Comments)    CHEST PAIN CHEST PAIN   . Ciprofloxacin   . Penicillins Rash    Aspirin prescribed at discharge?  Yes High Intensity Statin Prescribed? (Lipitor 40-80mg  or Crestor 20-40mg ): Yes Beta Blocker Prescribed? Yes For EF <40%, was ACEI/ARB Prescribed? Yes ADP Receptor Inhibitor Prescribed? (i.e. Plavix etc.-Includes Medically Managed Patients): Yes For EF <40%, Aldosterone Inhibitor Prescribed? Yes Was EF assessed during THIS hospitalization? Yes Was Cardiac Rehab II ordered? (Included Medically managed Patients): Yes   Outstanding Labs/Studies   Will need BMET at f/u  Duration of Discharge Encounter   Greater than 30 minutes including physician time.  Signed, Charlie Pitter PA-C 03/25/2018, 12:07 PM

## 2018-03-25 NOTE — Progress Notes (Addendum)
CARDIOLOGY ROUNDING NOTE Subjective:  No chest pain or shortness of breath.  Ambulated well with cardiac rehab.  Looking forward to going home today.  Is going to stay with her daughter for a few weeks to get better.   Echo reviewed 03/24/2018 by Dr. Haroldine Laws, EF 40-45% with inferolateral HK and no MR   Intake/Output Summary (Last 24 hours) at 03/25/2018 0913 Last data filed at 03/25/2018 0604 Gross per 24 hour  Intake 723 ml  Output 1725 ml  Net -1002 ml    Current meds: . aspirin  81 mg Oral Daily  . atorvastatin  80 mg Oral q1800  . carvedilol  3.125 mg Oral BID WC  . heparin  60 Units/kg Intravenous Once  . heparin  5,000 Units Subcutaneous Q8H  . losartan  12.5 mg Oral Daily  . nicotine  14 mg Transdermal Daily  . sodium chloride flush  3 mL Intravenous Q12H  . spironolactone  12.5 mg Oral Daily  . ticagrelor  90 mg Oral BID   Infusions: . sodium chloride    . sodium chloride       Objective:  Blood pressure 99/69, pulse 90, temperature 98.7 F (37.1 C), temperature source Oral, resp. rate (!) 21, height 5' (1.524 m), weight 64.9 kg, last menstrual period 03/29/2016, SpO2 96 %. Weight change:   Physical Exam: General: Well developed, well nourished, female in no acute distress Head: Eyes PERRLA, No xanthomas.   Normocephalic and atraumatic Lungs: Clear bilaterally to auscultation. Heart: HRRR S1 S2, without MRG.  Pulses are 2+ & equal. No JVD. Abdomen: Bowel sounds are present, abdomen soft and non-tender without masses or  hernias noted. Msk: Normal strength and tone for age. Extremities: No clubbing, cyanosis or edema.    Skin:  No rashes or lesions noted. Neuro: Alert and oriented X 3. Psych:  Good affect, responds appropriately   Telemetry: 01/11 Sinus/sinus tach 90-110  9-beat run NSVT early am 03/25/2018: SR, ST, no NSVT 24 HR - Personally reviewed   Lab Results: Basic Metabolic Panel: Recent Labs  Lab 03/22/18 2321 03/23/18 0443 03/23/18 1440  03/24/18 0348 03/25/18 0411  NA 135 134* 133* 133*  --   K 3.1* 4.3 4.2 3.9  --   CL 98 101 103 103  --   CO2 25 21* 21* 22  --   GLUCOSE 148* 170* 160* 156*  --   BUN 22* 15 12 9   --   CREATININE 1.35* 1.13* 1.01* 0.92  --   CALCIUM 9.5 8.8* 8.8* 8.4*  --   MG  --   --   --   --  2.1   Liver Function Tests: No results for input(s): AST, ALT, ALKPHOS, BILITOT, PROT, ALBUMIN in the last 168 hours.  CBC: Recent Labs  Lab 03/22/18 2321 03/23/18 0443 03/23/18 0920  WBC 8.6 13.9*  --   HGB 13.7 12.0  --   HCT 42.4 37.7  --   MCV 88.1 86.1  --   PLT 325 280 267   Cardiac Enzymes: Recent Labs  Lab 03/22/18 2321 03/23/18 0443 03/23/18 0920 03/23/18 1440  TROPONINI <0.03 >65.00* >65.00* >65.00*   BNP: Invalid input(s): POCBNP CBG: Recent Labs  Lab 03/23/18 1116  GLUCAP 131*   Microbiology: Lab Results  Component Value Date   CULT ESCHERICHIA COLI 10/08/2010   CULT ESCHERICHIA COLI 09/24/2010   CULT ESCHERICHIA COLI 08/24/2010   Lab Results  Component Value Date   HGBA1C 7.0 (H) 03/25/2018  Lab Results  Component Value Date   TSH 5.968 (H) 03/25/2018     Imaging: No results found. Left Ventricle The left ventricular size is normal. There is mild to moderate left ventricular systolic dysfunction. LV end diastolic pressure is mildly elevated. The left ventricular ejection fraction is 35-45% by visual estimate.  Coronary Diagrams   Diagnostic  Dominance: Right       ASSESSMENT:    53 y/o woman with HTN and tobacco use admitted 1/10 with inferior STEMI.  She was noted to have total occlusion of a large obtuse marginal branch treated with PCI.  She is to be continued on tirofiban for 18 hours with residual thrombus burden.   PLAN/DISCUSSION:  1. CAD with inferolateral STEMI  - s/p DES RI, large thrombus burden so completed 18 hours of Aggrastat. - Continue ASA, high-dose statin, beta-blocker, Brilinta. -Patient wants to use the [facility] pharmacy for DC  meds. -Follow-up with cardiac rehab as an outpatient. -No increase in beta-blocker dose due to systolic blood pressure 20N at times  2. Ischemic CM - EF 40-45% by echo with no MR, slightly less at cath (35-45%) -Tolerating low-dose Coreg, Spironolactone 12.5 mg daily and losartan 12.5 mg daily -Blood pressure limits titration of meds.  3. NSVT -None in 24 hours, continue beta-blocker -Magnesium was greater than 2 and potassium greater than 4 -Encourage intake of potassium rich foods.  4. HTN -Controlled, no changes  5. Tobacco use -Cessation emphasized  6. Hyperglycemia>> diabetes -Hemoglobin A1c elevated at 7 - add ADA diet, MD advise on med changes  7. Hypokalemia - supplemented and improved, Spiro added  8. Sinus tachycardia - on BB, TSH slightly elevated, have ordered free T3 and T4 -Unless these labs are acutely abnormal, follow-up with PCP.  9. Dispo - Discuss d/c w/ MD   LOS: 2 days    Rosaria Ferries, PA-C 03/25/2018, 9:13 AM   History and all data above reviewed.  Patient examined.  I agree with the findings as above.  No chest pain.  No SOB.  Wants to go home.The patient exam reveals COR: RRR  ,  Lungs: Clear  ,  Abd: Positive bowel sounds, no rebound no guarding, Ext No edema   .  All available labs, radiology testing, previous records reviewed. Agree with documented assessment and plan.   OK to go home.  Plans as above.  I would suggest 250 bid of metformin at discharge and follow up with her PCP.  She reports that she was on metformin in the past but was taken off because sugar was controlled. However, A1c as above.   Jeneen Rinks Penni Penado  10:41 AM  03/25/2018

## 2018-03-25 NOTE — Discharge Instructions (Signed)

## 2018-03-26 ENCOUNTER — Telehealth: Payer: Self-pay | Admitting: Physician Assistant

## 2018-03-26 ENCOUNTER — Telehealth: Payer: Self-pay | Admitting: Nurse Practitioner

## 2018-03-26 LAB — T3, FREE: T3, Free: 2.2 pg/mL (ref 2.0–4.4)

## 2018-03-26 MED FILL — Nitroglycerin IV Soln 100 MCG/ML in D5W: INTRA_ARTERIAL | Qty: 10 | Status: AC

## 2018-03-26 NOTE — Telephone Encounter (Signed)
Please print out and hold for visit. Will recheck lab on return.

## 2018-03-26 NOTE — Telephone Encounter (Signed)
New message   Scheduled TOC Appt per Melina Copa on 04/03/2018 at 11:30am with Truitt Merle.

## 2018-03-26 NOTE — Telephone Encounter (Signed)
Returned the call to [facility], spoke with the pharmacist, and she just needed to clarify how many days the pt should check her sugar daily. It has been reiterated that pt should be checking 2x's daily, according to discharge paperwork.

## 2018-03-26 NOTE — Telephone Encounter (Signed)
Can you call to find out what's needed? Pt was discharged yesterday from [facility]. Dayna Dunn PA-C

## 2018-03-26 NOTE — Telephone Encounter (Signed)
**Note De-Identified Brooke Williamson Obfuscation** Patient contacted regarding discharge from [facility] on 03/25/2018.  Patient understands to follow up with provider Truitt Merle, NP on 04/03/2018 at 11:30 at [facility] in [place]. Patient understands discharge instructions? Yes Patient understands medications and regiment? Yes Patient understands to bring all medications to this visit? Yes

## 2018-03-26 NOTE — Telephone Encounter (Signed)
° ° °  Please call [facility] [phone], questions regarding order for diabetic supplies

## 2018-04-03 ENCOUNTER — Ambulatory Visit: Payer: 59 | Admitting: Nurse Practitioner

## 2018-04-03 ENCOUNTER — Encounter: Payer: Self-pay | Admitting: Nurse Practitioner

## 2018-04-03 VITALS — BP 120/80 | HR 93 | Ht 60.0 in | Wt 143.0 lb

## 2018-04-03 DIAGNOSIS — I255 Ischemic cardiomyopathy: Secondary | ICD-10-CM

## 2018-04-03 DIAGNOSIS — I213 ST elevation (STEMI) myocardial infarction of unspecified site: Secondary | ICD-10-CM

## 2018-04-03 DIAGNOSIS — E7849 Other hyperlipidemia: Secondary | ICD-10-CM | POA: Diagnosis not present

## 2018-04-03 DIAGNOSIS — Z955 Presence of coronary angioplasty implant and graft: Secondary | ICD-10-CM | POA: Diagnosis not present

## 2018-04-03 NOTE — Patient Instructions (Addendum)
We will be checking the following labs today - BMET, LFTS, TSH & CBC   Medication Instructions:    Continue with your current medicines. BUT   I am increasing the Coreg to 6.25 mg to take twice a day - you can take 2 of the 3.125 mg tablets twice a day and use those up - the RX for 6.25 mg tablets are at your pharmacy.   Ok to take the Flagyl and Diflucan  Ok to stop the Wellbutrin if you'd like.    If you need a refill on your cardiac medications before your next appointment, please call your pharmacy.     Testing/Procedures To Be Arranged:  N/A  Follow-Up:   See me in 2 weeks with fasting labs   See Dr. Tamala Julian in 6 to 8 weeks.     At [facility], you and your health needs are our priority.  As part of our continuing mission to provide you with exceptional heart care, we have created designated Provider Care Teams.  These Care Teams include your primary Cardiologist (physician) and Advanced Practice Providers (APPs -  Physician Assistants and Nurse Practitioners) who all work together to provide you with the care you need, when you need it.  Special Instructions:  . Keep up the good work! . I will send a note to cardiac rehab  Call the [place] office at [phone] if you have any questions, problems or concerns.

## 2018-04-03 NOTE — Progress Notes (Signed)
CARDIOLOGY OFFICE NOTE  Date:  04/03/2018    [name] Date of Birth: 1965/06/25 Medical Record #[id number]  PCP:  [facility]  Cardiologist:  Tamala Julian (NEW)   Chief Complaint  Patient presents with  . Coronary Artery Disease    TOC visit - seen for Dr. Tamala Julian    History of Present Illness: Brooke Williamson is a 53 y.o. female who presents today for a TOC visit. Seen for Dr. Tamala Julian.   She has a history of HTN, HLD, DM, tobacco abuse, GERD, PUD, & thyroid disease.   She presented earlier this month to the [facility] for evaluation. There, her initial ECG was unremarkable, but repeat ECGs showed profound ST elevation in the inferior leads and V5-V6, along with ST depression in V1-V2. A code STEMI was activated, and she was transported directly to the [facility] cath lab. She received full dose ASA and IV fentanyl with some improvement in her pain, but still had ongoing symptoms upon her arrival.   She underwent emergent cath - showing occlusion of large branching 1st OM. She had PCI and stenting of the proximal to mid 1st OM. Distal embolization of thrombus was noted into the inferior most branch of the first obtuse marginal. LM, LAD, and RCA were normal. LVEF was 35-45% with LVEDP 17.She was started on DAPT with Brilinta - for 12 month duration.  Echo showed EF of 40 to 45%. Had NSVT noted - was placed on beta blocker. BP somewhat soft and prevented aggressive titration. Was placed on statin and smoking cessation advised. Was found to be diabetic and started on low dose Metformin - she was to see her PCP.   Comes in today. Here with her boyfriend - but he left the room. He smokes. She is down to one cigarette per day - she is thinking she does not need the Wellbutrin.and might stop altogether with no aid. Tolerating her medicines ok - mild shortness of breath with her Brilinta. Does not sound like this will be cost prohibitive at this  time. She is interested in cardiac rehab. 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 She is thinking that she will not be able to return to work for some time. She does heavy lifting/moving of boxes and walking. She is not dizzy or lightheaded. No further chest pain. Overall, she feels better. She has been able to get her diabetes "to go away" with diet and exercise.    Past Medical History:  Diagnosis Date  . CAD (coronary artery disease)    a. acute cath showed occlusion of large branching first obtuse marginal. She underwent Successful PCI and stent implantation in the proximal to mid first obtuse marginal. Distal embolization of thrombus was noted into the inferior most branch of the first obtuse marginal. LM, LAD, and RCA were normal. LVEF was 35-45% by cath 40-45% by echo.  . Diabetes mellitus type 2 in nonobese ([place])   . GERD (gastroesophageal reflux disease)   . Hypercholesteremia   . Hypertension   . Ischemic cardiomyopathy   . NSVT (nonsustained ventricular tachycardia) ([place])   . ST elevation myocardial infarction (STEMI) of inferior wall ([place]) 03/2018  . Thyroid disease   . Tobacco abuse   . Ulcer     Past Surgical History:  Procedure Laterality Date  . CORONARY/GRAFT ACUTE MI REVASCULARIZATION N/A 03/23/2018   Procedure: Coronary/Graft Acute MI Revascularization;  Surgeon: Belva Crome,  MD;  Location: [facility];  Service: Cardiovascular;  Laterality: N/A;  . LEFT HEART CATH AND CORONARY ANGIOGRAPHY N/A 03/23/2018   Procedure: LEFT HEART CATH AND CORONARY ANGIOGRAPHY;  Surgeon: Belva Crome, MD;  Location: [facility];  Service: Cardiovascular;  Laterality: N/A;  . THYROID SURGERY       Medications: Current Meds  Medication Sig  . aspirin 81 MG EC tablet Take 1 tablet (81 mg total) by mouth daily.  Marland Kitchen atorvastatin (LIPITOR) 80 MG tablet Take 1 tablet (80 mg total) by mouth every evening.  Marland Kitchen buPROPion (WELLBUTRIN SR) 150 MG 12 hr  tablet Take 150 mg by mouth 2 (two) times daily.   . carvedilol (COREG) 3.125 MG tablet Take 1 tablet (3.125 mg total) by mouth 2 (two) times daily with a meal.  . fluconazole (DIFLUCAN) 150 MG tablet Take 150 mg by mouth once.   Marland Kitchen levothyroxine (SYNTHROID, LEVOTHROID) 88 MCG tablet Take 88 mcg by mouth daily before breakfast.   . losartan (COZAAR) 25 MG tablet Take 0.5 tablets (12.5 mg total) by mouth daily.  . metFORMIN (GLUCOPHAGE) 500 MG tablet Take 0.5 tablets (250 mg total) by mouth 2 (two) times daily with a meal.  . metroNIDAZOLE (FLAGYL) 500 MG tablet Take 500 mg by mouth 2 (two) times daily.   . Multiple Vitamin (MULTIVITAMIN WITH MINERALS) TABS tablet Take 1 tablet by mouth daily.  . nitroGLYCERIN (NITROSTAT) 0.4 MG SL tablet Place 1 tablet (0.4 mg total) under the tongue every 5 (five) minutes as needed for chest pain (Up to 3 doses. If taking 3rd dose call 911).  . Omega-3 Fatty Acids (FISH OIL) 1000 MG CAPS Take 1,000 mg by mouth 2 (two) times daily.  Marland Kitchen spironolactone (ALDACTONE) 25 MG tablet Take 0.5 tablets (12.5 mg total) by mouth daily.  . ticagrelor (BRILINTA) 90 MG TABS tablet Take 1 tablet (90 mg total) by mouth 2 (two) times daily.     Allergies: Allergies  Allergen Reactions  . Nitrofurantoin Monohyd Macro Shortness Of Breath    rash  . Atorvastatin Other (See Comments)    CHEST PAIN CHEST PAIN   . Simvastatin Other (See Comments)    CHEST PAIN CHEST PAIN   . Ciprofloxacin   . Penicillins Rash    Social History: The patient  reports that she has been smoking cigarettes. She has never used smokeless tobacco. She reports previous alcohol use. She reports that she does not use drugs.   Family History: The patient's family history includes Heart attack in her brother and father; High blood pressure in her brother and mother; Hyperlipidemia in her brother.   Review of Systems: Please see the history of present illness.   Otherwise, the review of systems is  positive for none.   All other systems are reviewed and negative.   Physical Exam: VS:  BP 120/80 (BP Location: Left Arm, Patient Position: Sitting, Cuff Size: Normal)   Pulse 93   Ht 5' (1.524 m)   Wt 143 lb (64.9 kg)   LMP 03/29/2016   SpO2 98% Comment: at rest  BMI 27.93 kg/m  .  BMI Body mass index is 27.93 kg/m.  Wt Readings from Last 3 Encounters:  04/03/18 143 lb (64.9 kg)  03/25/18 143 lb (64.9 kg)  12/01/17 135 lb (61.2 kg)    General: Pleasant. Well developed, well nourished and in no acute distress.   HEENT: Normal.  Neck: Supple, no JVD, carotid bruits, or masses noted.  Cardiac: Regular rate and rhythm. HR is a little fast today. No murmurs, rubs, or gallops. No edema.  Respiratory:  Lungs are clear to auscultation bilaterally with normal work of breathing.  GI: Soft and nontender.  MS: No deformity or atrophy. Gait and ROM intact.  Skin: Warm and dry. Color is normal.  Neuro:  Strength and sensation are intact and no gross focal deficits noted.  Psych: Alert, appropriate and with normal affect. Cath site - right wrist looks fine.   LABORATORY DATA:  EKG:  EKG is not ordered today.  Lab Results  Component Value Date   WBC 13.9 (H) 03/23/2018   HGB 12.0 03/23/2018   HCT 37.7 03/23/2018   PLT 267 03/23/2018   GLUCOSE 156 (H) 03/24/2018   CHOL 212 (H) 03/23/2018   TRIG 29 03/23/2018   HDL 54 03/23/2018   LDLCALC 152 (H) 03/23/2018   ALT 47 (H) 03/25/2018   AST 165 (H) 03/25/2018   NA 133 (L) 03/24/2018   K 3.9 03/24/2018   CL 103 03/24/2018   CREATININE 0.92 03/24/2018   BUN 9 03/24/2018   CO2 22 03/24/2018   TSH 5.968 (H) 03/25/2018   HGBA1C 7.0 (H) 03/25/2018     BNP (last 3 results) Recent Labs    03/23/18 0920  BNP 60.8    ProBNP (last 3 results) No results for input(s): PROBNP in the last 8760 hours.   Other Studies Reviewed Today:  Echo Study Conclusions 03/2018  - Left ventricle: The cavity size was normal. Systolic function  was mildly to moderately reduced. The estimated ejection fraction was in the range of 40% to 45%. Hypokinesis of the basal-midanterolateral and inferolateral myocardium; consistent with ischemia or infarction in the distribution of the left circumflex coronary artery. Doppler parameters are consistent with abnormal left ventricular relaxation (grade 1 diastolic dysfunction).  Cardiac Cath Conclusion 03/2018   Acute inferolateral ST elevation myocardial infarction due to occlusion of a large branching first obtuse marginal.  Successful PCI and stent implantation in the proximal to mid first obtuse marginal reducing a 100% stenosis with TIMI grade 0 flow to TIMI grade III and 0% stenosis. The lesion substrate is a [place] 1, 0, 0 bifurcation with a single stent crossing over the high lateral side branch. Final balloon diameter 3 mm. Distal embolization of thrombus is noted into the inferior most branch of the first obtuse marginal. The circumflex proximal to and beyond the first obtuse marginal is normal.  Left main is normal  Left anterior descending wraps around the left ventricular apex and is normal.  Right coronary is dominant and normal.  Left ventricular function is reduced and estimated to be 35 to 45%. LVEDP 17 mmHg. Inferior and distal anterior wall hypokinesis is noted.  RECOMMENDATIONS:   Aspirin and Brilinta x12 months. Brilinta could eventually be transitioned to Plavix for cost purposes.  Smoking cessation  Aggressive risk factor modification also to should include blood pressure control and LDL lowering to less than 50.  Consider cardiac rehab  Discharge based upon clinical progress over the next 36 to 48 hours.     Assessment/Plan:    1. Inferior STEMI/CAD - emergent cath showed occlusion of large branching first obtuse marginal. She underwent Successful PCI and stent implantation in the proximal to mid first obtuse marginal.  She is on  DAPT for 12 months. CV risk factor modification encouraged. She seems motivated to make changes. Would probably be able to return to work on return visit. She does  not feel she can work at this time.   2. Ischemic cardiomyopathy - EF 35-45% as noted above. 2D echo 03/23/18 showed EF 40-45%, Hypokinesis of thebasal- midanterolateral and inferolateral myocardium; consistent with ischemia or infarction in the distribution of the leftcircumflex coronary artery. Checking lab today - increasing Coreg today to 6.25 mg BID. Plan to repeat echo 3 months after guideline therapy.   3. NSVT - previously noted on telemetry - no palpitations noted. Increasing beta blocker for LV dysfunction today and continued elevation in HR.   4. Hyperlipidemia - now on statin - no problems noted - recheck on return.   5. Elevated LFTs - recheck today.   6. HTN - BP ok - will try to increase the Coreg today.   7. Tobacco abuse - already down to 1 cig per day - she is thinking of just stopping altogether and not use the Wellbutrin. Says she will not be smoking by time of her return visit.   8. Hypothyroidism - recheck TSH today.   9. Recurrent DM - she says she has been able to make this go away when she was exercising, watching diet and losing weight. Seems motivated to make changes.   Current medicines are reviewed with the patient today.  The patient does not have concerns regarding medicines other than what has been noted above.  The following changes have been made:  See above.  Labs/ tests ordered today include:    Orders Placed This Encounter  Procedures  . Basic metabolic panel  . CBC  . TSH  . Hepatic function panel     Disposition:   FU with me in about 2 weeks for med titration. See Dr. Tamala Julian in about 6 weeks.   Patient is agreeable to this plan and will call if any problems develop in the interim.   SignedTruitt Merle, NP  04/03/2018 12:27 PM  [facility] [address] [place], [place]  [address] Phone: [phone] Fax: [phone]

## 2018-04-04 LAB — BASIC METABOLIC PANEL
BUN/Creatinine Ratio: 18 (ref 9–23)
BUN: 19 mg/dL (ref 6–24)
CO2: 26 mmol/L (ref 20–29)
Calcium: 9.8 mg/dL (ref 8.7–10.2)
Chloride: 96 mmol/L (ref 96–106)
Creatinine, Ser: 1.04 mg/dL — ABNORMAL HIGH (ref 0.57–1.00)
GFR calc Af Amer: 71 mL/min/{1.73_m2} (ref >59)
GFR calc non Af Amer: 61 mL/min/{1.73_m2} (ref >59)
Glucose: 94 mg/dL (ref 65–99)
Potassium: 4.4 mmol/L (ref 3.5–5.2)
Sodium: 136 mmol/L (ref 134–144)

## 2018-04-04 LAB — CBC
Hematocrit: 40.7 % (ref 34.0–46.6)
Hemoglobin: 13.4 g/dL (ref 11.1–15.9)
MCH: 28.6 pg (ref 26.6–33.0)
MCHC: 32.9 g/dL (ref 31.5–35.7)
MCV: 87 fL (ref 79–97)
Platelets: 439 10*3/uL (ref 150–450)
RBC: 4.68 x10E6/uL (ref 3.77–5.28)
RDW: 13.2 % (ref 11.7–15.4)
WBC: 6.3 10*3/uL (ref 3.4–10.8)

## 2018-04-04 LAB — TSH: TSH: 5.15 u[IU]/mL — ABNORMAL HIGH (ref 0.450–4.500)

## 2018-04-04 LAB — HEPATIC FUNCTION PANEL
ALT: 14 IU/L (ref 0–32)
AST: 18 IU/L (ref 0–40)
Albumin: 4.2 g/dL (ref 3.8–4.9)
Alkaline Phosphatase: 65 IU/L (ref 39–117)
Bilirubin Total: 0.3 mg/dL (ref 0.0–1.2)
Bilirubin, Direct: 0.09 mg/dL (ref 0.00–0.40)
Total Protein: 7.4 g/dL (ref 6.0–8.5)

## 2018-04-09 ENCOUNTER — Telehealth: Payer: Self-pay | Admitting: Nurse Practitioner

## 2018-04-09 MED ORDER — ROSUVASTATIN CALCIUM 20 MG PO TABS: 20.0000 mg | ORAL_TABLET | Freq: Every day | ORAL | 3 refills | Status: DC

## 2018-04-09 MED ORDER — CARVEDILOL 6.25 MG PO TABS: 6.2500 mg | ORAL_TABLET | Freq: Two times a day (BID) | ORAL | 3 refills | Status: DC

## 2018-04-09 NOTE — Telephone Encounter (Signed)
Has been on this dose through out her admission and denied any issue when seen - but will change to Crestor 20 mg a day.   See back as planned.   Burtis Junes, RN, [place] [facility] [address] [place], [place]  [id number] [phone]

## 2018-04-09 NOTE — Addendum Note (Signed)
Addended by: Frederik Schmidt on: 04/09/2018 01:21 PM   Modules accepted: Orders

## 2018-04-09 NOTE — Telephone Encounter (Signed)
Spoke to patient who has a questions regarding her medication (Atorvastatin).  She has a listed allergy to Atorvastatin (chest pain) but has been prescribed 80 mg nightly.  During the night she experiences CP, SOB and restlessness.  Please advise, thank you.

## 2018-04-09 NOTE — Telephone Encounter (Signed)
Spoke with patient and gave her Lori's recommendation.  We changed Atorvastatin to Rosuvastatin.  She will keep [place] updated as to how she is feeling.

## 2018-04-09 NOTE — Telephone Encounter (Signed)
° °  New message   Pt c/o medication issue:  1. Name of Medication: atorvastatin (LIPITOR) 80 MG tablet  2. How are you currently taking this medication (dosage and times per day)? 1 time a day at night  3. Are you having a reaction (difficulty breathing--STAT)?   4. What is your medication issue? Patient feels that she is having an allergic reaction. She is having chest pain and sob at night.     Pt c/o medication issue:  1. Name of Medication: carvedilol (COREG) 3.125 MG tablet  2. How are you currently taking this medication (dosage and times per day)? 2 tablets in morning and 2 at night  3. Are you having a reaction (difficulty breathing--STAT)? n/a  4. What is your medication issue? Patient states that the dosage change was never sent in to the pharmacy on file.  Marland Kitchen

## 2018-04-09 NOTE — Telephone Encounter (Signed)
lpmtcb 1/27 

## 2018-04-16 ENCOUNTER — Encounter: Payer: Self-pay | Admitting: Nurse Practitioner

## 2018-04-16 ENCOUNTER — Ambulatory Visit (INDEPENDENT_AMBULATORY_CARE_PROVIDER_SITE_OTHER): Payer: 59 | Admitting: Nurse Practitioner

## 2018-04-16 VITALS — BP 90/60 | HR 91 | Ht 60.0 in | Wt 142.0 lb

## 2018-04-16 DIAGNOSIS — I213 ST elevation (STEMI) myocardial infarction of unspecified site: Secondary | ICD-10-CM | POA: Diagnosis not present

## 2018-04-16 DIAGNOSIS — I255 Ischemic cardiomyopathy: Secondary | ICD-10-CM | POA: Diagnosis not present

## 2018-04-16 DIAGNOSIS — Z955 Presence of coronary angioplasty implant and graft: Secondary | ICD-10-CM

## 2018-04-16 DIAGNOSIS — E7849 Other hyperlipidemia: Secondary | ICD-10-CM | POA: Diagnosis not present

## 2018-04-16 NOTE — Patient Instructions (Addendum)
We will be checking the following labs today - NONE   Medication Instructions:    Continue with your current medicines.   Ok to try to take the Brilinta with caffeine - this may help the shortness of breath - if it does not - call us and we will change to Plavix  Ok to use over the counter Pepcid for indigestion   If you need a refill on your cardiac medications before your next appointment, please call your pharmacy.     Testing/Procedures To Be Arranged:  N/A  Follow-Up:   See Dr. Tamala Julian as planned with fasting labs    At [facility], you and your health needs are our priority.  As part of our continuing mission to provide you with exceptional heart care, we have created designated Provider Care Teams.  These Care Teams include your primary Cardiologist (physician) and Advanced Practice Providers (APPs -  Physician Assistants and Nurse Practitioners) who all work together to provide you with the care you need, when you need it.  Special Instructions:  . Keep working on the smoking cessation.   Call the [place] office at [phone] if you have any questions, problems or concerns.

## 2018-04-16 NOTE — Progress Notes (Signed)
CARDIOLOGY OFFICE NOTE  Date:  04/16/2018    [name] Date of Birth: 14-Oct-1965 Medical Record #[id number]  PCP:  Wynn Banker, PA  Cardiologist:  Jennings Books    Chief Complaint  Patient presents with  . Coronary Artery Disease    Follow up visit - seen for Dr. Tamala Julian    History of Present Illness: Brooke Williamson is a 53 y.o. female who presents today for a follow up visit.  Seen for Dr. Tamala Julian.   She has a history of HTN, HLD, DM, tobacco abuse, GERD, PUD, & thyroid disease.   She presented back in January 2020 to the [facility] for evaluation of chest pain. There, her initial ECG was unremarkable, but repeat ECGs showed profound ST elevation in the inferior leads and V5-V6, along with ST depression in V1-V2. A code STEMI was activated, and she was transported directly to the [facility] cath lab. She received full dose ASA and IV fentanyl with some improvement in her pain, but still had ongoing symptoms upon her arrival.  She underwent emergent cath - showing occlusion of large branching 1st OM. She had PCI and stenting of the proximal to mid 1st OM. Distal embolization of thrombuswasnoted into the inferior most branch of the first obtuse marginal.LM, LAD, and RCA were normal. LVEF was 35-45% with LVEDP 17.She was started on DAPT with Brilinta - for 12 month duration.  Echo showed EF of 40 to 45%. Had NSVT noted - was placed on beta blocker. BP somewhat soft and prevented aggressive titration. Was placed on statin and smoking cessation advised. Was found to be diabetic and started on low dose Metformin - she was to see her PCP.  I then saw her back 2 weeks ago - she was doing well. Down to one cigarette per day. Mild dyspnea with her Brilinta. She was working 2 jobs previously. No chest pain. She noted that she was thinking of returning to work in March. Hospital records noted possible issue with her statin but had been placed on high  dose therapy and denied any issue to me. Coreg was increased in order to have on a more guideline driven therapy.   Phone call last week - noting chest pain and shortness of breath with her statin (since the hospitalization) but not endorsed to me - this was changed from Lipitor to Crestor.    Comes in today. Here with her boyfriend. She says she is doing well. Did get a phone call about cardiac rehab - hopes to start soon. Smoking only one cigarette per day - she will "take a puff" when she feels a craving and then "put it out". One lasts her the entire day for now. She is asking about changing the Brilinta to Plavix for shortness of breath - has not tried caffeine as we discussed but wants to and wants to try and stay with Brilinta. No chest pain like her prior presentation. Her right arm is still a little sore. She is doing housework, cooking, cleaning, etc., without issue. She is not lightheaded or dizzy.   Past Medical History:  Diagnosis Date  . CAD (coronary artery disease)    a. acute cath showed occlusion of large branching first obtuse marginal. She underwent Successful PCI and stent implantation in the proximal to mid first obtuse marginal. Distal embolization of thrombus was noted into the inferior most branch of the first obtuse marginal. LM, LAD, and RCA were normal. LVEF was  35-45% by cath 40-45% by echo.  . Diabetes mellitus type 2 in nonobese ([place])   . GERD (gastroesophageal reflux disease)   . Hypercholesteremia   . Hypertension   . Ischemic cardiomyopathy   . NSVT (nonsustained ventricular tachycardia) ([place])   . ST elevation myocardial infarction (STEMI) of inferior wall ([place]) 03/2018  . Thyroid disease   . Tobacco abuse   . Ulcer     Past Surgical History:  Procedure Laterality Date  . CORONARY/GRAFT ACUTE MI REVASCULARIZATION N/A 03/23/2018   Procedure: Coronary/Graft Acute MI Revascularization;  Surgeon: Belva Crome, MD;  Location: [facility];  Service:  Cardiovascular;  Laterality: N/A;  . LEFT HEART CATH AND CORONARY ANGIOGRAPHY N/A 03/23/2018   Procedure: LEFT HEART CATH AND CORONARY ANGIOGRAPHY;  Surgeon: Belva Crome, MD;  Location: [facility];  Service: Cardiovascular;  Laterality: N/A;  . THYROID SURGERY       Medications: Current Meds  Medication Sig  . aspirin 81 MG EC tablet Take 1 tablet (81 mg total) by mouth daily.  . carvedilol (COREG) 6.25 MG tablet Take 1 tablet (6.25 mg total) by mouth 2 (two) times daily.  . fluconazole (DIFLUCAN) 150 MG tablet Take 150 mg by mouth once.   Marland Kitchen levothyroxine (SYNTHROID, LEVOTHROID) 88 MCG tablet Take 88 mcg by mouth daily before breakfast.   . losartan (COZAAR) 25 MG tablet Take 0.5 tablets (12.5 mg total) by mouth daily.  . metFORMIN (GLUCOPHAGE) 500 MG tablet Take 0.5 tablets (250 mg total) by mouth 2 (two) times daily with a meal.  . Multiple Vitamin (MULTIVITAMIN WITH MINERALS) TABS tablet Take 1 tablet by mouth daily.  . nitroGLYCERIN (NITROSTAT) 0.4 MG SL tablet Place 1 tablet (0.4 mg total) under the tongue every 5 (five) minutes as needed for chest pain (Up to 3 doses. If taking 3rd dose call 911).  . Omega-3 Fatty Acids (FISH OIL) 1000 MG CAPS Take 1,000 mg by mouth 2 (two) times daily.  . rosuvastatin (CRESTOR) 20 MG tablet Take 1 tablet (20 mg total) by mouth daily.  Marland Kitchen spironolactone (ALDACTONE) 25 MG tablet Take 0.5 tablets (12.5 mg total) by mouth daily.  . ticagrelor (BRILINTA) 90 MG TABS tablet Take 1 tablet (90 mg total) by mouth 2 (two) times daily.  . [DISCONTINUED] buPROPion (WELLBUTRIN SR) 150 MG 12 hr tablet Take 150 mg by mouth 2 (two) times daily.      Allergies: Allergies  Allergen Reactions  . Nitrofurantoin Monohyd Macro Shortness Of Breath    rash  . Atorvastatin Other (See Comments)    CHEST PAIN CHEST PAIN   . Simvastatin Other (See Comments)    CHEST PAIN CHEST PAIN   . Ciprofloxacin   . Penicillins Rash    Social History: The patient   reports that she has been smoking cigarettes. She has never used smokeless tobacco. She reports previous alcohol use. She reports that she does not use drugs.   Family History: The patient's family history includes Heart attack in her brother and father; High blood pressure in her brother and mother; Hyperlipidemia in her brother.   Review of Systems: Please see the history of present illness.   Otherwise, the review of systems is positive for none.   All other systems are reviewed and negative.   Physical Exam: VS:  BP 90/60 (BP Location: Right Arm, Patient Position: Sitting, Cuff Size: Normal)   Pulse 91   Ht 5' (1.524 m)   Wt 142 lb (64.4  kg)   LMP 03/29/2016   SpO2 100% Comment: at rest  BMI 27.73 kg/m  .  BMI Body mass index is 27.73 kg/m.  Wt Readings from Last 3 Encounters:  04/16/18 142 lb (64.4 kg)  04/03/18 143 lb (64.9 kg)  03/25/18 143 lb (64.9 kg)    General: Alert and in no acute distress. Weight is down one pound. She smells of tobacco.   HEENT: Normal.  Neck: Supple, no JVD, carotid bruits, or masses noted.  Cardiac: Regular rate and rhythm. Heart rate still a little fast - better on my exam - 88. No edema.  Respiratory:  Lungs are clear to auscultation bilaterally with normal work of breathing.  GI: Soft and nontender.  MS: No deformity or atrophy. Gait and ROM intact.  Skin: Warm and dry. Color is normal.  Neuro:  Strength and sensation are intact and no gross focal deficits noted.  Psych: Alert, appropriate and with normal affect.   LABORATORY DATA:  EKG:  EKG is not ordered today.  Lab Results  Component Value Date   WBC 6.3 04/03/2018   HGB 13.4 04/03/2018   HCT 40.7 04/03/2018   PLT 439 04/03/2018   GLUCOSE 94 04/03/2018   CHOL 212 (H) 03/23/2018   TRIG 29 03/23/2018   HDL 54 03/23/2018   LDLCALC 152 (H) 03/23/2018   ALT 14 04/03/2018   AST 18 04/03/2018   NA 136 04/03/2018   K 4.4 04/03/2018   CL 96 04/03/2018   CREATININE 1.04 (H)  04/03/2018   BUN 19 04/03/2018   CO2 26 04/03/2018   TSH 5.150 (H) 04/03/2018   HGBA1C 7.0 (H) 03/25/2018     BNP (last 3 results) Recent Labs    03/23/18 0920  BNP 60.8    ProBNP (last 3 results) No results for input(s): PROBNP in the last 8760 hours.   Other Studies Reviewed Today:  Echo Study Conclusions 03/2018  - Left ventricle: The cavity size was normal. Systolic function was mildly to moderately reduced. The estimated ejection fraction was in the range of 40% to 45%. Hypokinesis of the basal-midanterolateral and inferolateral myocardium; consistent with ischemia or infarction in the distribution of the left circumflex coronary artery. Doppler parameters are consistent with abnormal left ventricular relaxation (grade 1 diastolic dysfunction).  Cardiac Cath Conclusion 03/2018   Acute inferolateral ST elevation myocardial infarction due to occlusion of a large branching first obtuse marginal.  Successful PCI and stent implantation in the proximal to mid first obtuse marginal reducing a 100% stenosis with TIMI grade 0 flow to TIMI grade III and 0% stenosis. The lesion substrate is a [place] 1, 0, 0 bifurcation with a single stent crossing over the high lateral side branch. Final balloon diameter 3 mm. Distal embolization of thrombus is noted into the inferior most branch of the first obtuse marginal. The circumflex proximal to and beyond the first obtuse marginal is normal.  Left main is normal  Left anterior descending wraps around the left ventricular apex and is normal.  Right coronary is dominant and normal.  Left ventricular function is reduced and estimated to be 35 to 45%. LVEDP 17 mmHg. Inferior and distal anterior wall hypokinesis is noted.  RECOMMENDATIONS:   Aspirin and Brilinta x12 months. Brilinta could eventually be transitioned to Plavix for cost purposes.  Smoking cessation  Aggressive risk factor modification also to  should include blood pressure control and LDL lowering to less than 50.  Consider cardiac rehab  Discharge based upon clinical progress  over the next 36 to 48 hours.     Assessment/Plan:  1. Inferior STEMI/CAD -emergent cath showed occlusion of large branching first obtuse marginal. She underwent successful PCI and stent implantation in the proximal to mid first obtuse marginal. She is on DAPT for 12 months along with CV risk factor modification. She has no active symptoms, performing all ADLS and is felt to be stable to return to work from our standpoint. Discussed with Dr. Tamala Julian who is in a agreement. She does have some shortness of breath related to her Brilinta - she is wanting to try some caffeine - if not able to tolerate -she could change to Plavix - she would need to be reloaded if switched.   2. Ischemic cardiomyopathy- EF 35-45% per cath. 2D echo 03/23/18 showed EF 40-45%, Hypokinesis of thebasal- midanterolateral and inferolateral myocardium; consistent with ischemia or infarction in the distribution of the leftcircumflex coronary artery. Coreg increased at last visit - BP too soft to titrate meds further. Consider repeat echo 3 months after guideline therapy.   3. NSVT -previously noted on telemetry - no palpitations noted. No changes made today.   4. Hyperlipidemia- now on statin - no problems noted - recheck fasting on her return visit with Dr. Tamala Julian.    5. Elevated LFTs - resolved.   6. HTN - no issues - see above.   7. Tobacco abuse - remains down at 1 cig per day. Total cessation encouraged.   8. Hypothyroidism - needs to see PCP.   9. Recurrent DM - she said at her last visit that she would be able to make this go away when she was exercising, watching diet and losing weight. Weight is down one pound.   Current medicines are reviewed with the patient today.  The patient does not have concerns regarding medicines other than what has been noted  above.  The following changes have been made:  See above.  Labs/ tests ordered today include:   No orders of the defined types were placed in this encounter.    Disposition:   FU with Dr. Tamala Julian as planned later this month.    Patient is agreeable to this plan and will call if any problems develop in the interim.   SignedTruitt Merle, NP  04/16/2018 10:57 AM  [facility] [address] [place], [place]  [address] Phone: [phone] Fax: [phone]

## 2018-04-24 ENCOUNTER — Telehealth: Payer: Self-pay

## 2018-04-24 NOTE — Telephone Encounter (Signed)
We received a letter from [employer] stating that Brilinta is on the pts plan's list of covered meds.  I have faxed this letter to [facility] so they can fill this RX.

## 2018-04-24 NOTE — Telephone Encounter (Signed)
**Note De-Identified Brooke Williamson Obfuscation** I have done a Brilinta PA through covermymeds. Key: [id number]

## 2018-05-09 NOTE — Progress Notes (Signed)
Cardiology Office Note:    Date:  05/10/2018   ID:  Brooke Williamson, DOB 25-Jan-1966, MRN [id number]  PCP:  Wynn Banker, PA  Cardiologist:  Sinclair Grooms, MD   Referring MD: [facility]   Chief Complaint  Patient presents with  . Coronary Artery Disease    History of Present Illness:    Brooke Williamson is a 53 y.o. female with a hx of  HTN, HLD,DM,tobacco abuse, GERD, PUD,&thyroid disease and recent lateral STEMI treated with DES.  Overall, doing well.  Having dyspnea with Brilinta.  No angina.  Physically active.  Has a nicotine patch and not smoking.  She comes in fasting today.  She had run out of all her medications when she presented with the infarct.  Past Medical History:  Diagnosis Date  . CAD (coronary artery disease)    a. acute cath showed occlusion of large branching first obtuse marginal. She underwent Successful PCI and stent implantation in the proximal to mid first obtuse marginal. Distal embolization of thrombus was noted into the inferior most branch of the first obtuse marginal. LM, LAD, and RCA were normal. LVEF was 35-45% by cath 40-45% by echo.  . Diabetes mellitus type 2 in nonobese ([place])   . GERD (gastroesophageal reflux disease)   . Hypercholesteremia   . Hypertension   . Ischemic cardiomyopathy   . NSVT (nonsustained ventricular tachycardia) ([place])   . ST elevation myocardial infarction (STEMI) of inferior wall ([place]) 03/2018  . Thyroid disease   . Tobacco abuse   . Ulcer     Past Surgical History:  Procedure Laterality Date  . CORONARY/GRAFT ACUTE MI REVASCULARIZATION N/A 03/23/2018   Procedure: Coronary/Graft Acute MI Revascularization;  Surgeon: Belva Crome, MD;  Location: [facility];  Service: Cardiovascular;  Laterality: N/A;  . LEFT HEART CATH AND CORONARY ANGIOGRAPHY N/A 03/23/2018   Procedure: LEFT HEART CATH AND CORONARY ANGIOGRAPHY;  Surgeon: Belva Crome, MD;  Location: [facility];   Service: Cardiovascular;  Laterality: N/A;  . THYROID SURGERY      Current Medications: Current Meds  Medication Sig  . aspirin 81 MG EC tablet Take 1 tablet (81 mg total) by mouth daily.  . carvedilol (COREG) 6.25 MG tablet Take 1 tablet (6.25 mg total) by mouth 2 (two) times daily.  . fluconazole (DIFLUCAN) 150 MG tablet Take 150 mg by mouth once.   Marland Kitchen levothyroxine (SYNTHROID, LEVOTHROID) 88 MCG tablet Take 88 mcg by mouth daily before breakfast.   . losartan (COZAAR) 25 MG tablet Take 0.5 tablets (12.5 mg total) by mouth daily.  . metFORMIN (GLUCOPHAGE) 500 MG tablet Take 0.5 tablets (250 mg total) by mouth 2 (two) times daily with a meal.  . Multiple Vitamin (MULTIVITAMIN WITH MINERALS) TABS tablet Take 1 tablet by mouth daily.  . nicotine (NICODERM CQ - DOSED IN MG/24 HOURS) 14 mg/24hr patch Place 14 mg onto the skin daily.  . nitroGLYCERIN (NITROSTAT) 0.4 MG SL tablet Place 1 tablet (0.4 mg total) under the tongue every 5 (five) minutes as needed for chest pain (Up to 3 doses. If taking 3rd dose call 911).  . Omega-3 Fatty Acids (FISH OIL) 1000 MG CAPS Take 1,000 mg by mouth 2 (two) times daily.  . rosuvastatin (CRESTOR) 20 MG tablet Take 1 tablet (20 mg total) by mouth daily.  Marland Kitchen spironolactone (ALDACTONE) 25 MG tablet Take 0.5 tablets (12.5 mg total) by mouth daily.  . [DISCONTINUED] ticagrelor (BRILINTA) 90 MG TABS  tablet Take 1 tablet (90 mg total) by mouth 2 (two) times daily.     Allergies:   Nitrofurantoin monohyd macro; Atorvastatin; Simvastatin; Ciprofloxacin; and Penicillins   Social History   Socioeconomic History  . Marital status: Single    Spouse name: Not on file  . Number of children: Not on file  . Years of education: Not on file  . Highest education level: Not on file  Occupational History  . Not on file  Social Needs  . Financial resource strain: Not on file  . Food insecurity:    Worry: Not on file    Inability: Not on file  . Transportation needs:     Medical: Not on file    Non-medical: Not on file  Tobacco Use  . Smoking status: Current Every Day Smoker    Types: Cigarettes  . Smokeless tobacco: Never Used  Substance and Sexual Activity  . Alcohol use: Not Currently    Comment: occ  . Drug use: No  . Sexual activity: Not on file  Lifestyle  . Physical activity:    Days per week: Not on file    Minutes per session: Not on file  . Stress: Not on file  Relationships  . Social connections:    Talks on phone: Not on file    Gets together: Not on file    Attends religious service: Not on file    Active member of club or organization: Not on file    Attends meetings of clubs or organizations: Not on file    Relationship status: Not on file  Other Topics Concern  . Not on file  Social History Narrative  . Not on file     Family History: The patient's family history includes Heart attack in her brother and father; High blood pressure in her brother and mother; Hyperlipidemia in her brother.  ROS:   Please see the history of present illness.    Dyspnea as mentioned before.  Urinating for cigarettes.  Not smoking cigarettes.  Encouraged abstinence.  Constipation.  Anxiety.  All other systems reviewed and are negative.  EKGs/Labs/Other Studies Reviewed:    The following studies were reviewed today: Coronary angiography from January was personally reviewed.  EKG:  EKG is not repeated.  Recent Labs: 03/23/2018: B Natriuretic Peptide 60.8 03/25/2018: Magnesium 2.1 04/03/2018: ALT 14; BUN 19; Creatinine, Ser 1.04; Hemoglobin 13.4; Platelets 439; Potassium 4.4; Sodium 136; TSH 5.150  Recent Lipid Panel    Component Value Date/Time   CHOL 212 (H) 03/23/2018 0443   TRIG 29 03/23/2018 0443   HDL 54 03/23/2018 0443   CHOLHDL 3.9 03/23/2018 0443   VLDL 6 03/23/2018 0443   LDLCALC 152 (H) 03/23/2018 0443    Physical Exam:    VS:  BP 112/74   Pulse 76   Ht 5' (1.524 m)   Wt 145 lb 9.6 oz (66 kg)   LMP 03/29/2016   SpO2 98%    BMI 28.44 kg/m     Wt Readings from Last 3 Encounters:  05/10/18 145 lb 9.6 oz (66 kg)  04/16/18 142 lb (64.4 kg)  04/03/18 143 lb (64.9 kg)     GEN: Healthy-appearing African-American female.. No acute distress HEENT: Normal NECK: No JVD. LYMPHATICS: No lymphadenopathy CARDIAC: RRR.  No murmur, no gallop, no edema VASCULAR: 2+ bilateral radial pulses, no bruits RESPIRATORY:  Clear to auscultation without rales, wheezing or rhonchi  ABDOMEN: Soft, non-tender, non-distended, No pulsatile mass, MUSCULOSKELETAL: No deformity  SKIN: Warm  and dry NEUROLOGIC:  Alert and oriented x 3 PSYCHIATRIC:  Normal affect   ASSESSMENT:    1. S/P coronary artery stent placement   2. Other hyperlipidemia   3. Ischemic cardiomyopathy    PLAN:    In order of problems listed above:  1. Currently stable, without angina.  Palpitations and dyspnea.  Dyspnea is related to Brilinta.  Switch to clopidogrel 75 mg/day with a 300 mg loading dose.  Discontinue Brilinta. 2. Lipid panel will be obtained today.  She is on 20 mg of rosuvastatin. 3. No evidence of volume overload.  Overall education and awareness concerning primary/secondary risk prevention was discussed in detail: LDL less than 70, hemoglobin A1c less than 7, blood pressure target less than 130/80 mmHg, >150 minutes of moderate aerobic activity per week, avoidance of smoking, weight control (via diet and exercise), and continued surveillance/management of/for obstructive sleep apnea.  58-month follow-up   Medication Adjustments/Labs and Tests Ordered: Current medicines are reviewed at length with the patient today.  Concerns regarding medicines are outlined above.  No orders of the defined types were placed in this encounter.  No orders of the defined types were placed in this encounter.   Patient Instructions  Medication Instructions:  1) DISCONTINUE Brilinta  2) START Plavix- take 4 tablets (300mg  total) along with your last dose  of Brilinta, then take 1 tablet daily  If you need a refill on your cardiac medications before your next appointment, please call your pharmacy.   Lab work: None If you have labs (blood work) drawn today and your tests are completely normal, you will receive your results only by: Marland Kitchen MyChart Message (if you have MyChart) OR . A paper copy in the mail If you have any lab test that is abnormal or we need to change your treatment, we will call you to review the results.  Testing/Procedures: None  Follow-Up: At [facility], you and your health needs are our priority.  As part of our continuing mission to provide you with exceptional heart care, we have created designated Provider Care Teams.  These Care Teams include your primary Cardiologist (physician) and Advanced Practice Providers (APPs -  Physician Assistants and Nurse Practitioners) who all work together to provide you with the care you need, when you need it. You will need a follow up appointment in 3 months.  Please call our office 2 months in advance to schedule this appointment.  You may see Sinclair Grooms, MD or one of the following Advanced Practice Providers on your designated Care Team:   Truitt Merle, NP Cecilie Kicks, NP . Kathyrn Drown, NP  Any Other Special Instructions Will Be Listed Below (If Applicable).       Signed, Sinclair Grooms, MD  05/10/2018 10:06 AM    [place]

## 2018-05-10 ENCOUNTER — Ambulatory Visit: Payer: 59 | Admitting: Interventional Cardiology

## 2018-05-10 ENCOUNTER — Encounter: Payer: Self-pay | Admitting: Interventional Cardiology

## 2018-05-10 ENCOUNTER — Telehealth: Payer: Self-pay

## 2018-05-10 VITALS — BP 112/74 | HR 76 | Ht 60.0 in | Wt 145.6 lb

## 2018-05-10 DIAGNOSIS — E7849 Other hyperlipidemia: Secondary | ICD-10-CM | POA: Diagnosis not present

## 2018-05-10 DIAGNOSIS — Z955 Presence of coronary angioplasty implant and graft: Secondary | ICD-10-CM

## 2018-05-10 DIAGNOSIS — I255 Ischemic cardiomyopathy: Secondary | ICD-10-CM

## 2018-05-10 MED ORDER — CLOPIDOGREL BISULFATE 75 MG PO TABS: 75.0000 mg | ORAL_TABLET | Freq: Every day | ORAL | 3 refills | Status: DC

## 2018-05-10 NOTE — Telephone Encounter (Signed)
Spoke with pt and advised that Plavix has been sent to the pharmacy.  Pt appreciative for call.

## 2018-05-10 NOTE — Addendum Note (Signed)
Addended by: Loren Racer on: 05/10/2018 04:40 PM   Modules accepted: Orders

## 2018-05-10 NOTE — Patient Instructions (Addendum)
Medication Instructions:  1) DISCONTINUE Brilinta  2) START Plavix- take 4 tablets (300mg  total) along with your last dose of Brilinta, then take 1 tablet daily  If you need a refill on your cardiac medications before your next appointment, please call your pharmacy.   Lab work: Lipid, liver and BMET today  If you have labs (blood work) drawn today and your tests are completely normal, you will receive your results only by: Marland Kitchen MyChart Message (if you have MyChart) OR . A paper copy in the mail If you have any lab test that is abnormal or we need to change your treatment, we will call you to review the results.  Testing/Procedures: None  Follow-Up: At [facility], you and your health needs are our priority.  As part of our continuing mission to provide you with exceptional heart care, we have created designated Provider Care Teams.  These Care Teams include your primary Cardiologist (physician) and Advanced Practice Providers (APPs -  Physician Assistants and Nurse Practitioners) who all work together to provide you with the care you need, when you need it. You will need a follow up appointment in 3 months.  Please call our office 2 months in advance to schedule this appointment.  You may see Sinclair Grooms, MD or one of the following Advanced Practice Providers on your designated Care Team:   Truitt Merle, NP Cecilie Kicks, NP . Kathyrn Drown, NP  Any Other Special Instructions Will Be Listed Below (If Applicable).

## 2018-05-11 ENCOUNTER — Telehealth: Payer: Self-pay | Admitting: *Deleted

## 2018-05-11 LAB — HEPATIC FUNCTION PANEL
ALT: 30 IU/L (ref 0–32)
AST: 23 IU/L (ref 0–40)
Albumin: 4.7 g/dL (ref 3.8–4.9)
Alkaline Phosphatase: 50 IU/L (ref 39–117)
BILIRUBIN TOTAL: 0.2 mg/dL (ref 0.0–1.2)
BILIRUBIN, DIRECT: 0.1 mg/dL (ref 0.00–0.40)
Total Protein: 6.7 g/dL (ref 6.0–8.5)

## 2018-05-11 LAB — BASIC METABOLIC PANEL
BUN/Creatinine Ratio: 15 (ref 9–23)
BUN: 15 mg/dL (ref 6–24)
CO2: 23 mmol/L (ref 20–29)
Calcium: 9.6 mg/dL (ref 8.7–10.2)
Chloride: 102 mmol/L (ref 96–106)
Creatinine, Ser: 1.02 mg/dL — ABNORMAL HIGH (ref 0.57–1.00)
GFR calc Af Amer: 72 mL/min/{1.73_m2} (ref >59)
GFR calc non Af Amer: 63 mL/min/{1.73_m2} (ref >59)
Glucose: 129 mg/dL — ABNORMAL HIGH (ref 65–99)
Potassium: 4.4 mmol/L (ref 3.5–5.2)
Sodium: 141 mmol/L (ref 134–144)

## 2018-05-11 LAB — LIPID PANEL
CHOL/HDL RATIO: 2.1 ratio (ref 0.0–4.4)
Cholesterol, Total: 124 mg/dL (ref 100–199)
HDL: 58 mg/dL (ref >39)
LDL Calculated: 53 mg/dL (ref 0–99)
Triglycerides: 65 mg/dL (ref 0–149)
VLDL Cholesterol Cal: 13 mg/dL (ref 5–40)

## 2018-05-11 NOTE — Telephone Encounter (Signed)
   Primary Cardiologist: Sinclair Grooms, MD  Chart reviewed as part of pre-operative protocol coverage. Pt had a STEMI requiring stent placement on 03/23/2018 and should not interrupt dual antiplatelet therapy for 12 months.   I contacted Dr. Bary Leriche office to find out if this procedure is urgent or it can wait 12 months. They will call back with further information: [phone]. Callback pool - please update the request.   Ledora Bottcher, PA 05/11/2018, 4:39 PM

## 2018-05-11 NOTE — Telephone Encounter (Signed)
   [facility] Pre-operative Risk Assessment    Request for surgical clearance:  1. What type of surgery is being performed? COLONOSCOPY   2. When is this surgery scheduled? 05/24/18   3. What type of clearance is required (medical clearance vs. Pharmacy clearance to hold med vs. Both)? MEDICAL  4. Are there any medications that need to be held prior to surgery and how long?BRILINTA X 2 DAYS    5. Practice name and name of physician performing surgery? [facility]; DR. Reesa Chew   6. What is your office phone number [phone]    7.   What is your office fax number [phone]  8.   Anesthesia type (None, local, MAC, general) ? PROPOFOL   Julaine Hua 05/11/2018, 3:56 PM  _________________________________________________________________   (provider comments below)

## 2018-05-14 NOTE — Telephone Encounter (Signed)
   Primary Cardiologist: Sinclair Grooms, MD  Chart reviewed as part of pre-operative protocol coverage.  Pt had a STEMI requiring stent placement on 03/23/2018 and should not interrupt dual antiplatelet therapy for 12 months.   I contacted Dr. Bary Leriche office to find out if this procedure is urgent. It appears that this is a screening colonoscopy that is recommended at age 53. As this is routine and not urgent, we recommend completing 12 months of antiplatelet therapy prior to interruption for this procedure. Consider having this done after 03/24/2019. If felt to be more urgent please contact us again. Could consider another screening method or doing diagnostic study only without interruption of DAPT.   I have called and discussed our recommendation with the patient and she is OK with waiting until next January. She is reminded not to stop antiplatelet therapy unless approved by her cardiologist.   Please contact us again in January if plan for colonoscopy at that time.   Daune Perch, NP 05/14/2018, 11:08 AM

## 2018-05-21 ENCOUNTER — Other Ambulatory Visit: Payer: Self-pay | Admitting: Physician Assistant

## 2018-05-29 ENCOUNTER — Other Ambulatory Visit (HOSPITAL_COMMUNITY): Payer: Self-pay | Admitting: Physician Assistant

## 2018-05-30 IMAGING — CR DG WRIST COMPLETE 3+V*R*
4 series · 4 of 4 positions shown · non-contrast
Comparison: None.

CLINICAL DATA: Radial sided wrist pain for 1 week.

EXAM:
RIGHT WRIST - COMPLETE 3+ VIEW

[x wrist pa right]
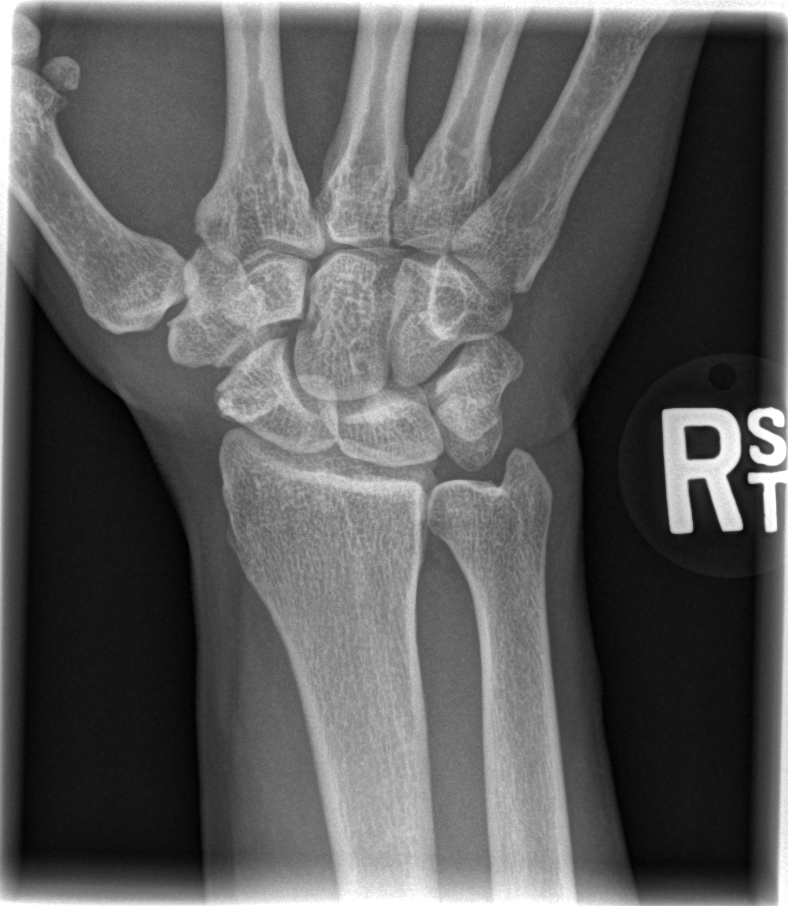

[x wrist obl right]
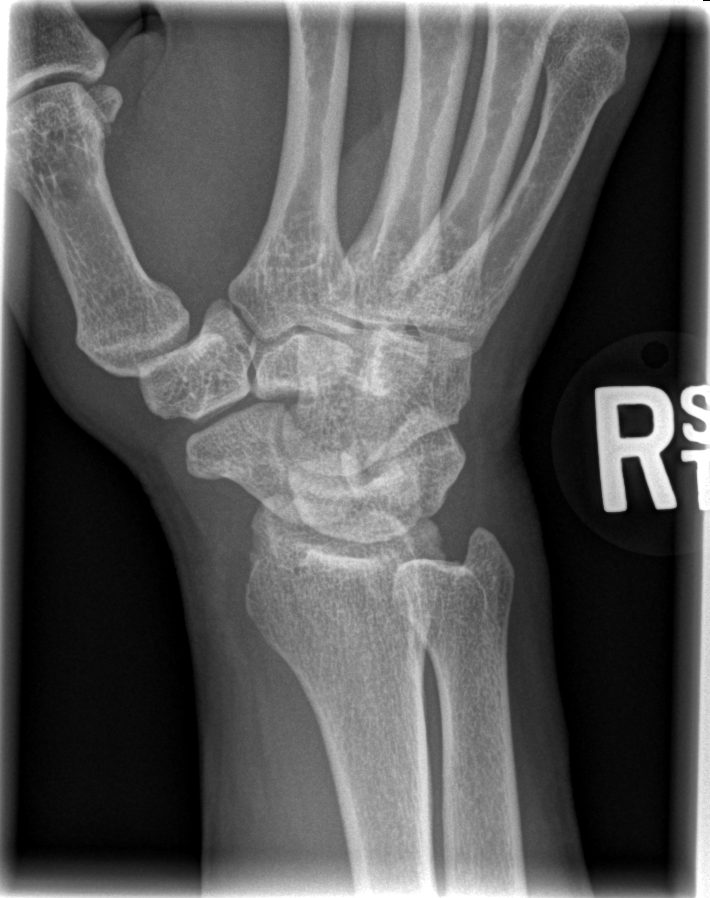

[x wrist lat right]
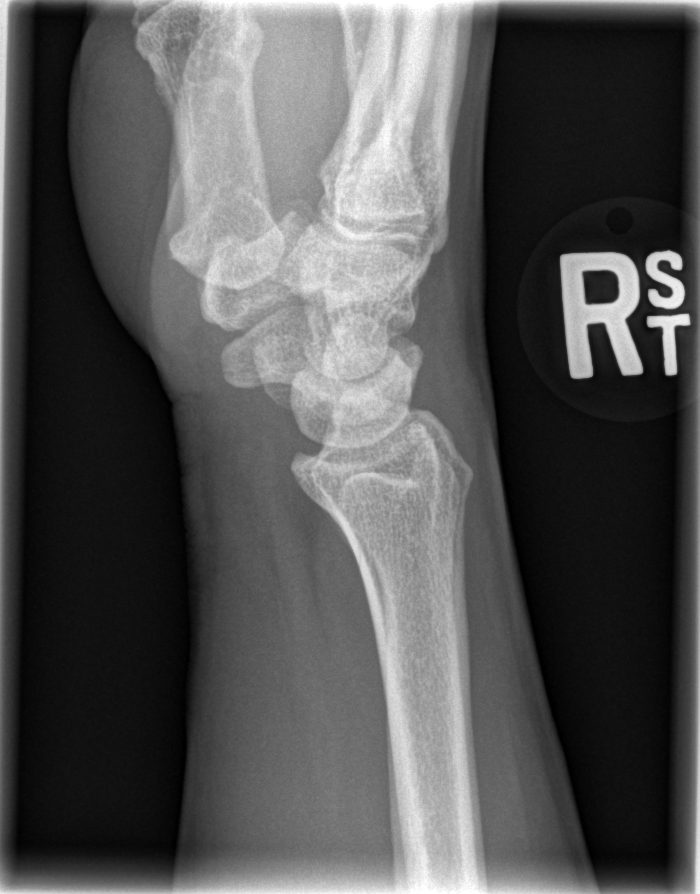

[x navicular]
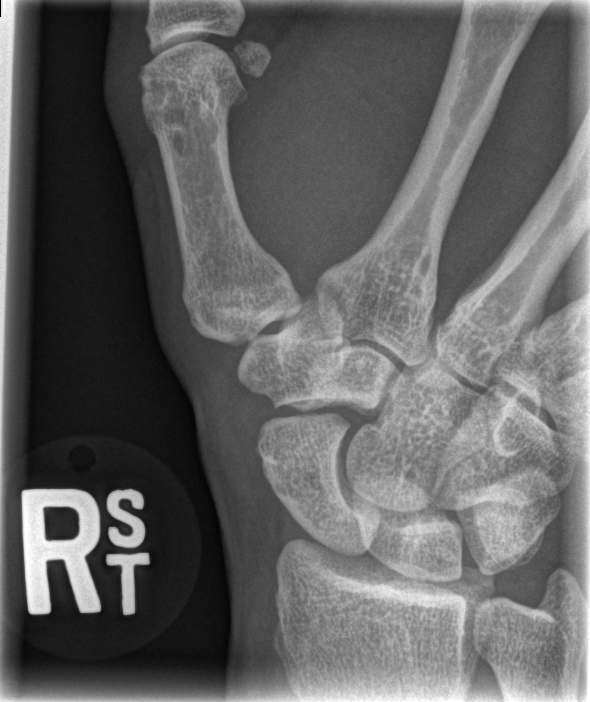

[4 of 4 positions shown; findings below may reference images not displayed]

FINDINGS: There is no evidence of fracture or dislocation. There is no
evidence of arthropathy or other focal bone abnormality. Soft
tissues are unremarkable.
IMPRESSION: Unremarkable radiographs of the right wrist.

## 2018-06-18 ENCOUNTER — Telehealth: Payer: Self-pay | Admitting: Interventional Cardiology

## 2018-06-18 NOTE — Telephone Encounter (Signed)
Pt called to let us know she is trying to set up her rx to be delivered through [facility]. They may be getting in touch with the office  Via fax to verify all of her current medications.  She called [phone]. She did not provide a mailing address for verification

## 2018-08-03 ENCOUNTER — Telehealth: Payer: Self-pay | Admitting: Interventional Cardiology

## 2018-08-03 NOTE — Telephone Encounter (Signed)
Spoke with pt in regards to appt with Dr. Tamala Julian on 6/4.  Pt agreeable to switch to a video visit using [employer].  Advised CMA will call her prior to her appt to go over details.  Pt appreciative for call.

## 2018-08-15 NOTE — Progress Notes (Deleted)
{Choose 1 Note Type (Telehealth Visit or Telephone Visit):[phone]}   Date:  08/15/2018   ID:  Brooke Williamson, DOB 08-May-1965, MRN [id number]  {Patient [phone]::"Home"} {Provider Location:[phone]::"Home"}  PCP:  Wynn Banker, PA  Cardiologist:  Sinclair Grooms, MD *** Electrophysiologist:  None   Evaluation Performed:  {Choose Visit Type:[phone]::"Follow-Up Visit"}  Chief Complaint:  ***  History of Present Illness:    Brooke Williamson is a 53 y.o. female with with a hx of HTN, HLD,DM,tobacco abuse, GERD, PUD,&thyroid disease and recent lateral STEMI treated with DES.  ***  The patient {does/does not:200015} have symptoms concerning for COVID-19 infection (fever, chills, cough, or new shortness of breath).    Past Medical History:  Diagnosis Date  . CAD (coronary artery disease)    a. acute cath showed occlusion of large branching first obtuse marginal. She underwent Successful PCI and stent implantation in the proximal to mid first obtuse marginal. Distal embolization of thrombus was noted into the inferior most branch of the first obtuse marginal. LM, LAD, and RCA were normal. LVEF was 35-45% by cath 40-45% by echo.  . Diabetes mellitus type 2 in nonobese ([place])   . GERD (gastroesophageal reflux disease)   . Hypercholesteremia   . Hypertension   . Ischemic cardiomyopathy   . NSVT (nonsustained ventricular tachycardia) ([place])   . ST elevation myocardial infarction (STEMI) of inferior wall ([place]) 03/2018  . Thyroid disease   . Tobacco abuse   . Ulcer    Past Surgical History:  Procedure Laterality Date  . CORONARY/GRAFT ACUTE MI REVASCULARIZATION N/A 03/23/2018   Procedure: Coronary/Graft Acute MI Revascularization;  Surgeon: Belva Crome, MD;  Location: [place] [facility];  Service: Cardiovascular;  Laterality: N/A;  . LEFT HEART CATH AND CORONARY ANGIOGRAPHY N/A 03/23/2018   Procedure: LEFT HEART CATH AND CORONARY ANGIOGRAPHY;   Surgeon: Belva Crome, MD;  Location: [place] [facility];  Service: Cardiovascular;  Laterality: N/A;  . THYROID SURGERY       No outpatient medications have been marked as taking for the 08/16/18 encounter (Appointment) with Belva Crome, MD.     Allergies:   Nitrofurantoin monohyd macro; Atorvastatin; Simvastatin; Ciprofloxacin; and Penicillins   Social History   Tobacco Use  . Smoking status: Current Every Day Smoker    Types: Cigarettes  . Smokeless tobacco: Never Used  Substance Use Topics  . Alcohol use: Not Currently    Comment: occ  . Drug use: No     Family Hx: The patient's family history includes Heart attack in her brother and father; High blood pressure in her brother and mother; Hyperlipidemia in her brother.  ROS:   Please see the history of present illness.    *** All other systems reviewed and are negative.   Prior CV studies:   The following studies were reviewed today:  ***  Labs/Other Tests and Data Reviewed:    EKG:  {EKG/Telemetry Strips Reviewed:[id number]}  Recent Labs: 03/23/2018: B Natriuretic Peptide 60.8 03/25/2018: Magnesium 2.1 04/03/2018: Hemoglobin 13.4; Platelets 439; TSH 5.150 05/10/2018: ALT 30; BUN 15; Creatinine, Ser 1.02; Potassium 4.4; Sodium 141   Recent Lipid Panel Lab Results  Component Value Date/Time   CHOL 124 05/10/2018 10:33 AM   TRIG 65 05/10/2018 10:33 AM   HDL 58 05/10/2018 10:33 AM   CHOLHDL 2.1 05/10/2018 10:33 AM   CHOLHDL 3.9 03/23/2018 04:43 AM   LDLCALC 53 05/10/2018 10:33 AM    Wt Readings from Last 3 Encounters:  05/10/18 145  lb 9.6 oz (66 kg)  04/16/18 142 lb (64.4 kg)  04/03/18 143 lb (64.9 kg)     Objective:    Vital Signs:  LMP 03/29/2016    {HeartCare Virtual Exam (Optional):[phone]::"VITAL SIGNS:  reviewed"}  ASSESSMENT & PLAN:    1. ***  COVID-19 Education: The signs and symptoms of COVID-19 were discussed with the patient and how to seek care for testing (follow up with PCP or  arrange E-visit).  ***The importance of social distancing was discussed today.  Time:   Today, I have spent *** minutes with the patient with telehealth technology discussing the above problems.     Medication Adjustments/Labs and Tests Ordered: Current medicines are reviewed at length with the patient today.  Concerns regarding medicines are outlined above.   Tests Ordered: No orders of the defined types were placed in this encounter.   Medication Changes: No orders of the defined types were placed in this encounter.   Disposition:  Follow up {follow up:15908}  Signed, Sinclair Grooms, MD  08/15/2018 2:38 PM    [facility]

## 2018-08-16 ENCOUNTER — Other Ambulatory Visit: Payer: Self-pay

## 2018-08-16 ENCOUNTER — Telehealth: Payer: Self-pay | Admitting: Interventional Cardiology

## 2018-08-31 ENCOUNTER — Telehealth: Payer: Self-pay | Admitting: Interventional Cardiology

## 2018-08-31 NOTE — Telephone Encounter (Signed)
Spoke with pt and screened for COVID.      COVID-19 Pre-Screening Questions:  . In the past 7 to 10 days have you had a cough,  shortness of breath, headache, congestion, fever (100 or greater) body aches, chills, sore throat, or sudden loss of taste or sense of smell? No . Have you been around anyone with known Covid 19. . Have you been around anyone who is awaiting Covid 19 test results in the past 7 to 10 days? No . Have you been around anyone who has been exposed to Covid 19, or has mentioned symptoms of Covid 19 within the past 7 to 10 days? No  If you have any concerns/questions about symptoms patients report during screening (either on the phone or at threshold). Contact the provider seeing the patient or DOD for further guidance.  If neither are available contact a member of the leadership team.

## 2018-09-02 NOTE — Progress Notes (Signed)
Cardiology Office Note:    Date:  09/03/2018   ID:  Brooke Williamson, DOB 01/16/1966, MRN [id number]  PCP:  Wynn Banker, PA  Cardiologist:  Sinclair Grooms, MD   Referring MD: Wynn Banker*   Chief Complaint  Patient presents with  . Coronary Artery Disease    History of Present Illness:    Brooke Williamson is a 53 y.o. female with a hx of HTN, HLD,DM II,tobacco abuse, GERD, PUD,&thyroid disease and recent lateral STEMI treated with DES 03/2018  She is doing well from a cardiac standpoint.  She has occasional elbow discomfort that awakens her from sleep.  She has not had substernal discomfort as she did with her myocardial infarction.  She is having difficulty with smoking cessation.  She is physically active without significant complaints.  She is compliant with medication regimen.  Considerable time spent discussing secondary risk factor modification including the importance of walking, hemoglobin A1c less than 7, and smoking cessation..  Past Medical History:  Diagnosis Date  . CAD (coronary artery disease)    a. acute cath showed occlusion of large branching first obtuse marginal. She underwent Successful PCI and stent implantation in the proximal to mid first obtuse marginal. Distal embolization of thrombus was noted into the inferior most branch of the first obtuse marginal. LM, LAD, and RCA were normal. LVEF was 35-45% by cath 40-45% by echo.  . Diabetes mellitus type 2 in nonobese ([place])   . GERD (gastroesophageal reflux disease)   . Hypercholesteremia   . Hypertension   . Ischemic cardiomyopathy   . NSVT (nonsustained ventricular tachycardia) ([place])   . ST elevation myocardial infarction (STEMI) of inferior wall ([place]) 03/2018  . Thyroid disease   . Tobacco abuse   . Ulcer     Past Surgical History:  Procedure Laterality Date  . CORONARY/GRAFT ACUTE MI REVASCULARIZATION N/A 03/23/2018   Procedure: Coronary/Graft Acute MI  Revascularization;  Surgeon: Belva Crome, MD;  Location: [place] [facility];  Service: Cardiovascular;  Laterality: N/A;  . LEFT HEART CATH AND CORONARY ANGIOGRAPHY N/A 03/23/2018   Procedure: LEFT HEART CATH AND CORONARY ANGIOGRAPHY;  Surgeon: Belva Crome, MD;  Location: [place] [facility];  Service: Cardiovascular;  Laterality: N/A;  . THYROID SURGERY      Current Medications: Current Meds  Medication Sig  . aspirin 81 MG EC tablet Take 1 tablet (81 mg total) by mouth daily.  . carvedilol (COREG) 6.25 MG tablet Take 1 tablet (6.25 mg total) by mouth 2 (two) times daily.  . clopidogrel (PLAVIX) 75 MG tablet Take 1 tablet (75 mg total) by mouth daily.  Marland Kitchen levothyroxine (SYNTHROID, LEVOTHROID) 88 MCG tablet Take 88 mcg by mouth daily before breakfast.   . losartan (COZAAR) 25 MG tablet Take 0.5 tablets (12.5 mg total) by mouth daily.  . metFORMIN (GLUCOPHAGE) 500 MG tablet TAKE 1/2 TABLET BY MOUTH TWICE DAILY WITH A MEAL  . Multiple Vitamin (MULTIVITAMIN WITH MINERALS) TABS tablet Take 1 tablet by mouth daily.  . nitroGLYCERIN (NITROSTAT) 0.4 MG SL tablet Place 1 tablet (0.4 mg total) under the tongue every 5 (five) minutes as needed for chest pain (Up to 3 doses. If taking 3rd dose call 911).  . Omega-3 Fatty Acids (FISH OIL) 1000 MG CAPS Take 1,000 mg by mouth 2 (two) times daily.  . rosuvastatin (CRESTOR) 20 MG tablet Take 1 tablet (20 mg total) by mouth daily.  Marland Kitchen spironolactone (ALDACTONE) 25 MG tablet Take 0.5 tablets (12.5  mg total) by mouth daily.     Allergies:   Nitrofurantoin monohyd macro, Atorvastatin, Simvastatin, Ciprofloxacin, and Penicillins   Social History   Socioeconomic History  . Marital status: Single    Spouse name: Not on file  . Number of children: Not on file  . Years of education: Not on file  . Highest education level: Not on file  Occupational History  . Not on file  Social Needs  . Financial resource strain: Not on file  . Food insecurity     Worry: Not on file    Inability: Not on file  . Transportation needs    Medical: Not on file    Non-medical: Not on file  Tobacco Use  . Smoking status: Current Every Day Smoker    Types: Cigarettes  . Smokeless tobacco: Never Used  Substance and Sexual Activity  . Alcohol use: Not Currently    Comment: occ  . Drug use: No  . Sexual activity: Not on file  Lifestyle  . Physical activity    Days per week: Not on file    Minutes per session: Not on file  . Stress: Not on file  Relationships  . Social Herbalist on phone: Not on file    Gets together: Not on file    Attends religious service: Not on file    Active member of club or organization: Not on file    Attends meetings of clubs or organizations: Not on file    Relationship status: Not on file  Other Topics Concern  . Not on file  Social History Narrative  . Not on file     Family History: The patient's family history includes Heart attack in her brother and father; High blood pressure in her brother and mother; Hyperlipidemia in her brother.  ROS:   Please see the history of present illness.    Significant stress related to her young adult 62s-year-old sons.  Encounters with police.  Difficulty sleeping.  All other systems reviewed and are negative.  EKGs/Labs/Other Studies Reviewed:    The following studies were reviewed today: Cardiac catheterization with PCI January 2020 Diagnostic Dominance: Right  Intervention     EKG:  EKG not repeated  Recent Labs: 03/23/2018: B Natriuretic Peptide 60.8 03/25/2018: Magnesium 2.1 04/03/2018: Hemoglobin 13.4; Platelets 439; TSH 5.150 05/10/2018: ALT 30; BUN 15; Creatinine, Ser 1.02; Potassium 4.4; Sodium 141  Recent Lipid Panel    Component Value Date/Time   CHOL 124 05/10/2018 1033   TRIG 65 05/10/2018 1033   HDL 58 05/10/2018 1033   CHOLHDL 2.1 05/10/2018 1033   CHOLHDL 3.9 03/23/2018 0443   VLDL 6 03/23/2018 0443   LDLCALC 53 05/10/2018 1033     Physical Exam:    VS:  BP 108/68   Pulse 80   Ht 5' (1.524 m)   Wt 137 lb 9.6 oz (62.4 kg)   LMP 03/29/2016   SpO2 100%   BMI 26.87 kg/m     Wt Readings from Last 3 Encounters:  09/03/18 137 lb 9.6 oz (62.4 kg)  05/10/18 145 lb 9.6 oz (66 kg)  04/16/18 142 lb (64.4 kg)     GEN: Appears younger than stated age. No acute distress HEENT: Normal NECK: No JVD. LYMPHATICS: No lymphadenopathy CARDIAC: RRR.  No murmur, no gallop, no edema VASCULAR: 2+ bilateral radial pulses, no bruits RESPIRATORY:  Clear to auscultation without rales, wheezing or rhonchi  ABDOMEN: Soft, non-tender, non-distended, No pulsatile mass, MUSCULOSKELETAL: No  deformity  SKIN: Warm and dry NEUROLOGIC:  Alert and oriented x 3 PSYCHIATRIC:  Normal affect   ASSESSMENT:    1. S/P coronary artery stent placement   2. Other hyperlipidemia   3. Ischemic cardiomyopathy   4. Controlled type 2 diabetes mellitus with other circulatory complication, without long-term current use of insulin ([place])   5. Tobacco abuse   6. Educated About Covid-19 Virus Infection    PLAN:    In order of problems listed above:  1. Coronary artery disease with therapy of totally occluded ramus intermedius branch excellent stent result.  Secondary risk factor modification discussed in detail as noted below. 2. Target LDL less than 70. 3. No clinical evidence of heart failure. 4. A1c was 7 when most recently checked. 5. Resume nicotine patch 6. Social distancing discussed in detail  Overall education and awareness concerning primary/secondary risk prevention was discussed in detail: LDL less than 70, hemoglobin A1c less than 7, blood pressure target less than 130/80 mmHg, >150 minutes of moderate aerobic activity per week, avoidance of smoking, weight control (via diet and exercise), and continued surveillance/management of/for obstructive sleep apnea.    Medication Adjustments/Labs and Tests Ordered: Current medicines are  reviewed at length with the patient today.  Concerns regarding medicines are outlined above.  No orders of the defined types were placed in this encounter.  No orders of the defined types were placed in this encounter.   There are no Patient Instructions on file for this visit.   Signed, Sinclair Grooms, MD  09/03/2018 9:25 AM    [place]

## 2018-09-03 ENCOUNTER — Other Ambulatory Visit: Payer: Self-pay

## 2018-09-03 ENCOUNTER — Ambulatory Visit (INDEPENDENT_AMBULATORY_CARE_PROVIDER_SITE_OTHER): Payer: Self-pay | Admitting: Interventional Cardiology

## 2018-09-03 ENCOUNTER — Encounter: Payer: Self-pay | Admitting: Interventional Cardiology

## 2018-09-03 VITALS — BP 108/68 | HR 80 | Ht 60.0 in | Wt 137.6 lb

## 2018-09-03 DIAGNOSIS — E7849 Other hyperlipidemia: Secondary | ICD-10-CM

## 2018-09-03 DIAGNOSIS — E1159 Type 2 diabetes mellitus with other circulatory complications: Secondary | ICD-10-CM

## 2018-09-03 DIAGNOSIS — Z720 Tobacco use: Secondary | ICD-10-CM

## 2018-09-03 DIAGNOSIS — Z955 Presence of coronary angioplasty implant and graft: Secondary | ICD-10-CM

## 2018-09-03 DIAGNOSIS — I255 Ischemic cardiomyopathy: Secondary | ICD-10-CM

## 2018-09-03 DIAGNOSIS — Z7189 Other specified counseling: Secondary | ICD-10-CM

## 2018-09-03 NOTE — Patient Instructions (Addendum)

## 2018-09-19 ENCOUNTER — Telehealth: Payer: Self-pay | Admitting: Interventional Cardiology

## 2018-09-19 NOTE — Telephone Encounter (Signed)
Patient needs medical records from Dr. Tamala Julian faxed to the [facility] to verify the medications that Dr. Tamala Julian put the patient on.   You can fax the form to the office at [phone]

## 2018-11-22 ENCOUNTER — Other Ambulatory Visit: Payer: Self-pay | Admitting: Physician Assistant

## 2018-12-11 ENCOUNTER — Telehealth: Payer: Self-pay | Admitting: Interventional Cardiology

## 2018-12-11 NOTE — Telephone Encounter (Signed)
New Message  Patient states that when she lays down she is having a lot of pain in the arm that the stint was put in. She has nausea associated with the pain. Please give patient a call back to discuss.

## 2018-12-11 NOTE — Telephone Encounter (Signed)
Pt calling continues to have right arm pain and this has been ongoing since Cath in January Per pt reported this at last office visit and was told to call if symptoms persists. Per pt had arm pain last night and this is usually when it occurs  Also on some occasions pain is so severe that it causes nausea.Pt also notes on 1 occasion when standing  had an episode of pain  Pt also notes constipation Informed pt  may take OTC stool softner as directed for this.Will forward to Dr Tamala Julian for review .

## 2018-12-11 NOTE — Telephone Encounter (Signed)
Unlikely this pain has to do with the cath/pci. More likely cervical disk. Needs to check in with PCP.

## 2018-12-11 NOTE — Telephone Encounter (Signed)
Lm to call back ./cy 

## 2018-12-12 ENCOUNTER — Telehealth: Payer: Self-pay

## 2018-12-12 NOTE — Telephone Encounter (Signed)
Lm to call back ./cy 

## 2018-12-12 NOTE — Telephone Encounter (Signed)
LMTCB regarding her pain. Dr. Tamala Julian advising her to consult PCP.

## 2018-12-21 NOTE — Telephone Encounter (Signed)
Per pt arm is better since call made Will call PCP if has further incidences ./cy

## 2019-02-21 ENCOUNTER — Other Ambulatory Visit: Payer: Self-pay | Admitting: Interventional Cardiology

## 2019-02-21 MED ORDER — ROSUVASTATIN CALCIUM 20 MG PO TABS: 20.0000 mg | ORAL_TABLET | Freq: Every day | ORAL | 1 refills | Status: DC

## 2019-02-21 MED ORDER — CARVEDILOL 6.25 MG PO TABS: 6.2500 mg | ORAL_TABLET | Freq: Two times a day (BID) | ORAL | 1 refills | Status: DC

## 2019-02-21 MED ORDER — LOSARTAN POTASSIUM 25 MG PO TABS: 12.5000 mg | ORAL_TABLET | Freq: Every day | ORAL | 1 refills | Status: DC

## 2019-02-21 MED ORDER — SPIRONOLACTONE 25 MG PO TABS: 12.5000 mg | ORAL_TABLET | Freq: Every day | ORAL | 1 refills | Status: DC

## 2019-02-21 MED ORDER — ASPIRIN 81 MG PO TBEC: 81.0000 mg | DELAYED_RELEASE_TABLET | Freq: Every day | ORAL | 1 refills | Status: DC

## 2019-02-21 MED ORDER — CLOPIDOGREL BISULFATE 75 MG PO TABS: 75.0000 mg | ORAL_TABLET | Freq: Every day | ORAL | 1 refills | Status: DC

## 2019-02-21 NOTE — Telephone Encounter (Signed)
Pt's pharmacy is requesting a refill on metformin and levothyroxine. Would Dr. Tamala Julian like to refill these medications? Please address

## 2019-02-21 NOTE — Telephone Encounter (Signed)
Those will need to come from PCP.  Thanks.

## 2019-02-26 ENCOUNTER — Other Ambulatory Visit: Payer: Self-pay | Admitting: Nurse Practitioner

## 2019-03-05 NOTE — Progress Notes (Signed)
Cardiology Office Note:    Date:  03/06/2019   ID:  Brooke Williamson, DOB 1966/01/01, MRN [id number]  PCP:  Wynn Banker, PA  Cardiologist:  Sinclair Grooms, MD   Referring MD: Wynn Banker*   Chief Complaint  Patient presents with  . Coronary Artery Disease  . Advice Only    Risk factor modification    History of Present Illness:    Brooke Williamson is a 53 y.o. female with a hx of HTN, HLD,DM II,tobacco abuse, GERD, PUD,&thyroid diseaseand recent lateral STEMItreated with DES 03/2018  She is doing well.  She still smokes cigarettes.  She has lost 30 pounds.  She walks greater than 120 minutes/day on her job as a Engineering geologist at [employer].  She has not needed nitroglycerin.  She denies orthopnea, PND, and swelling.  Past Medical History:  Diagnosis Date  . CAD (coronary artery disease)    a. acute cath showed occlusion of large branching first obtuse marginal. She underwent Successful PCI and stent implantation in the proximal to mid first obtuse marginal. Distal embolization of thrombus was noted into the inferior most branch of the first obtuse marginal. LM, LAD, and RCA were normal. LVEF was 35-45% by cath 40-45% by echo.  . Diabetes mellitus type 2 in nonobese ([place])   . GERD (gastroesophageal reflux disease)   . Hypercholesteremia   . Hypertension   . Ischemic cardiomyopathy   . NSVT (nonsustained ventricular tachycardia) ([place])   . ST elevation myocardial infarction (STEMI) of inferior wall ([place]) 03/2018  . Thyroid disease   . Tobacco abuse   . Ulcer     Past Surgical History:  Procedure Laterality Date  . CORONARY/GRAFT ACUTE MI REVASCULARIZATION N/A 03/23/2018   Procedure: Coronary/Graft Acute MI Revascularization;  Surgeon: Belva Crome, MD;  Location: [facility];  Service: Cardiovascular;  Laterality: N/A;  . LEFT HEART CATH AND CORONARY ANGIOGRAPHY N/A 03/23/2018   Procedure: LEFT HEART CATH AND CORONARY ANGIOGRAPHY;   Surgeon: Belva Crome, MD;  Location: [facility];  Service: Cardiovascular;  Laterality: N/A;  . THYROID SURGERY      Current Medications: Current Meds  Medication Sig  . aspirin 81 MG EC tablet Take 1 tablet (81 mg total) by mouth daily.  . carvedilol (COREG) 6.25 MG tablet Take 1 tablet (6.25 mg total) by mouth 2 (two) times daily.  . clopidogrel (PLAVIX) 75 MG tablet Take 1 tablet (75 mg total) by mouth daily.  Marland Kitchen levothyroxine (SYNTHROID) 75 MCG tablet Take 75 mcg by mouth daily before breakfast.   . losartan (COZAAR) 25 MG tablet Take 0.5 tablets (12.5 mg total) by mouth daily.  . metFORMIN (GLUCOPHAGE) 500 MG tablet TAKE 1/2 TABLET BY MOUTH TWICE DAILY WITH A MEAL  . nitroGLYCERIN (NITROSTAT) 0.4 MG SL tablet Place 1 tablet (0.4 mg total) under the tongue every 5 (five) minutes as needed for chest pain (Up to 3 doses. If taking 3rd dose call 911).  . Omega-3 Fatty Acids (FISH OIL) 1000 MG CAPS Take 1,000 mg by mouth 2 (two) times daily.  . rosuvastatin (CRESTOR) 20 MG tablet TAKE 1 TABLET BY MOUTH EVERY DAY  . spironolactone (ALDACTONE) 25 MG tablet Take 0.5 tablets (12.5 mg total) by mouth daily.     Allergies:   Nitrofurantoin monohyd macro, Atorvastatin, Simvastatin, Ciprofloxacin, and Penicillins   Social History   Socioeconomic History  . Marital status: Single    Spouse name: Not on file  . Number  of children: Not on file  . Years of education: Not on file  . Highest education level: Not on file  Occupational History  . Not on file  Tobacco Use  . Smoking status: Current Every Day Smoker    Types: Cigarettes  . Smokeless tobacco: Never Used  Substance and Sexual Activity  . Alcohol use: Not Currently    Comment: occ  . Drug use: No  . Sexual activity: Not on file  Other Topics Concern  . Not on file  Social History Narrative  . Not on file   Social Determinants of Health   Financial Resource Strain:   . Difficulty of Paying Living Expenses: Not on  file  Food Insecurity:   . Worried About Charity fundraiser in the Last Year: Not on file  . Ran Out of Food in the Last Year: Not on file  Transportation Needs:   . Lack of Transportation (Medical): Not on file  . Lack of Transportation (Non-Medical): Not on file  Physical Activity:   . Days of Exercise per Week: Not on file  . Minutes of Exercise per Session: Not on file  Stress:   . Feeling of Stress : Not on file  Social Connections:   . Frequency of Communication with Friends and Family: Not on file  . Frequency of Social Gatherings with Friends and Family: Not on file  . Attends Religious Services: Not on file  . Active Member of Clubs or Organizations: Not on file  . Attends Archivist Meetings: Not on file  . Marital Status: Not on file     Family History: The patient's family history includes Heart attack in her brother and father; High blood pressure in her brother and mother; Hyperlipidemia in her brother.  ROS:   Please see the history of present illness.    Has been having palpitations she feels related to thyroid replacement therapy.  All other systems reviewed and are negative.  EKGs/Labs/Other Studies Reviewed:    The following studies were reviewed today:  A1c 6.3 at [facility]  LDL 98 at [facility] March 04, 2019  Potassium 4.3 creatinine 0.9  EKG:  EKG normal sinus rhythm, inferolateral infarction denoted by Q waves and T wave abnormality.  Atrial abnormality.  No acute ST-T wave change.  Recent Labs: 03/23/2018: B Natriuretic Peptide 60.8 03/25/2018: Magnesium 2.1 04/03/2018: Hemoglobin 13.4; Platelets 439; TSH 5.150 05/10/2018: ALT 30; BUN 15; Creatinine, Ser 1.02; Potassium 4.4; Sodium 141  Recent Lipid Panel    Component Value Date/Time   CHOL 124 05/10/2018 1033   TRIG 65 05/10/2018 1033   HDL 58 05/10/2018 1033   CHOLHDL 2.1 05/10/2018 1033   CHOLHDL 3.9 03/23/2018 0443   VLDL 6 03/23/2018 0443   LDLCALC 53 05/10/2018 1033     Physical Exam:    VS:  BP 106/68   Pulse 71   Ht 5' (1.524 m)   Wt 130 lb (59 kg)   LMP 03/29/2016   SpO2 99%   BMI 25.39 kg/m     Wt Readings from Last 3 Encounters:  03/06/19 130 lb (59 kg)  09/03/18 137 lb 9.6 oz (62.4 kg)  05/10/18 145 lb 9.6 oz (66 kg)     GEN: Has lost weight.. No acute distress HEENT: Conjunctival irritation/redness NECK: No JVD. LYMPHATICS: No lymphadenopathy CARDIAC:  RRR without murmur, gallop, or edema. VASCULAR:  Normal Pulses. No bruits. RESPIRATORY:  Clear to auscultation without rales, wheezing or rhonchi  ABDOMEN: Soft, non-tender,  non-distended, No pulsatile mass, MUSCULOSKELETAL: No deformity  SKIN: Warm and dry NEUROLOGIC:  Alert and oriented x 3 PSYCHIATRIC:  Normal affect   ASSESSMENT:    1. S/P coronary artery stent placement   2. Other hyperlipidemia   3. Ischemic cardiomyopathy   4. Controlled type 2 diabetes mellitus with other circulatory complication, without long-term current use of insulin ([place])   5. Tobacco abuse   6. Old MI (myocardial infarction)   7. Educated about COVID-19 virus infection    PLAN:    In order of problems listed above:  1. Secondary prevention discussed in great detail.  Most recent hemoglobin A1c is 6.3.  LDL cholesterol is 98, and she still smokes.  She walks greater than 120 minutes/day at her job. 2. Increase rosuvastatin to 40 mg/day. 3. 2D Doppler echocardiogram reassess LV function.  Last EF by echo was 40 to 45% 1 year ago. 4. A1c is less than 6.5.  Continue Metformin under the direction of her primary care physician. 5. Strongly encouraged to DC smoking 6. No specific comments 7. 3W's discussed in detail to avoid COVID-19 infection.  Overall education and awareness concerning primary/secondary risk prevention was discussed in detail: LDL less than 70, hemoglobin A1c less than 7, blood pressure target less than 130/80 mmHg, >150 minutes of moderate aerobic activity per week, avoidance  of smoking, weight control (via diet and exercise), and continued surveillance/management of/for obstructive sleep apnea.    Medication Adjustments/Labs and Tests Ordered: Current medicines are reviewed at length with the patient today.  Concerns regarding medicines are outlined above.  No orders of the defined types were placed in this encounter.  No orders of the defined types were placed in this encounter.   There are no Patient Instructions on file for this visit.   Signed, Sinclair Grooms, MD  03/06/2019 9:10 AM    [place]

## 2019-03-06 ENCOUNTER — Other Ambulatory Visit: Payer: Self-pay

## 2019-03-06 ENCOUNTER — Ambulatory Visit (INDEPENDENT_AMBULATORY_CARE_PROVIDER_SITE_OTHER): Payer: 59 | Admitting: Interventional Cardiology

## 2019-03-06 ENCOUNTER — Encounter: Payer: Self-pay | Admitting: Interventional Cardiology

## 2019-03-06 VITALS — BP 106/68 | HR 71 | Ht 60.0 in | Wt 130.0 lb

## 2019-03-06 DIAGNOSIS — I255 Ischemic cardiomyopathy: Secondary | ICD-10-CM

## 2019-03-06 DIAGNOSIS — Z720 Tobacco use: Secondary | ICD-10-CM

## 2019-03-06 DIAGNOSIS — E1159 Type 2 diabetes mellitus with other circulatory complications: Secondary | ICD-10-CM | POA: Diagnosis not present

## 2019-03-06 DIAGNOSIS — I252 Old myocardial infarction: Secondary | ICD-10-CM | POA: Diagnosis not present

## 2019-03-06 DIAGNOSIS — Z7189 Other specified counseling: Secondary | ICD-10-CM

## 2019-03-06 DIAGNOSIS — E7849 Other hyperlipidemia: Secondary | ICD-10-CM | POA: Diagnosis not present

## 2019-03-06 DIAGNOSIS — Z955 Presence of coronary angioplasty implant and graft: Secondary | ICD-10-CM

## 2019-03-06 MED ORDER — ROSUVASTATIN CALCIUM 40 MG PO TABS: 40.0000 mg | ORAL_TABLET | Freq: Every day | ORAL | 3 refills | Status: DC

## 2019-03-06 NOTE — Patient Instructions (Addendum)
Medication Instructions:  1) DISCONTINUE Aspirin after 03/24/2019 2) INCREASE Rosuvastatin to 40mg  once daily  *If you need a refill on your cardiac medications before your next appointment, please call your pharmacy*  Lab Work: Your physician recommends that you return for lab work in: 6-8 weeks (Lipid, Liver).  You will need to be fasting for these labs (nothing to eat or drink after midnight except water and black coffee).  If you have labs (blood work) drawn today and your tests are completely normal, you will receive your results only by: Marland Kitchen MyChart Message (if you have MyChart) OR . A paper copy in the mail If you have any lab test that is abnormal or we need to change your treatment, we will call you to review the results.  Testing/Procedures: Your physician has requested that you have an echocardiogram. Echocardiography is a painless test that uses sound waves to create images of your heart. It provides your doctor with information about the size and shape of your heart and how well your heart's chambers and valves are working. This procedure takes approximately one hour. There are no restrictions for this procedure.   Follow-Up: At [facility], you and your health needs are our priority.  As part of our continuing mission to provide you with exceptional heart care, we have created designated Provider Care Teams.  These Care Teams include your primary Cardiologist (physician) and Advanced Practice Providers (APPs -  Physician Assistants and Nurse Practitioners) who all work together to provide you with the care you need, when you need it.  Your next appointment:   12 month(s)  The format for your next appointment:   In Person  Provider:   You may see Sinclair Grooms, MD or one of the following Advanced Practice Providers on your designated Care Team:    Truitt Merle, NP  Cecilie Kicks, NP  Kathyrn Drown, NP   Other Instructions

## 2019-03-26 ENCOUNTER — Other Ambulatory Visit: Payer: Self-pay | Admitting: *Deleted

## 2019-04-23 ENCOUNTER — Other Ambulatory Visit: Payer: Self-pay

## 2019-04-23 ENCOUNTER — Other Ambulatory Visit (HOSPITAL_COMMUNITY): Payer: 59

## 2019-04-23 ENCOUNTER — Other Ambulatory Visit: Payer: 59

## 2019-04-23 DIAGNOSIS — E7849 Other hyperlipidemia: Secondary | ICD-10-CM

## 2019-04-23 LAB — LIPID PANEL
Chol/HDL Ratio: 2.3 ratio (ref 0.0–4.4)
Cholesterol, Total: 133 mg/dL (ref 100–199)
HDL: 57 mg/dL (ref >39)
LDL Chol Calc (NIH): 54 mg/dL (ref 0–99)
Triglycerides: 126 mg/dL (ref 0–149)
VLDL Cholesterol Cal: 22 mg/dL (ref 5–40)

## 2019-04-23 LAB — HEPATIC FUNCTION PANEL
ALT: 24 IU/L (ref 0–32)
AST: 21 IU/L (ref 0–40)
Albumin: 4.2 g/dL (ref 3.8–4.9)
Alkaline Phosphatase: 67 IU/L (ref 39–117)
Bilirubin Total: 0.3 mg/dL (ref 0.0–1.2)
Bilirubin, Direct: 0.08 mg/dL (ref 0.00–0.40)
Total Protein: 6.5 g/dL (ref 6.0–8.5)

## 2019-04-24 ENCOUNTER — Telehealth: Payer: Self-pay | Admitting: Interventional Cardiology

## 2019-04-24 NOTE — Telephone Encounter (Signed)
Received paperwork from [employer]. Placed in box for Dr. Tamala Julian to review. 04/24/19 vlm

## 2019-05-06 ENCOUNTER — Other Ambulatory Visit: Payer: Self-pay

## 2019-05-06 ENCOUNTER — Ambulatory Visit (HOSPITAL_COMMUNITY): Payer: 59 | Attending: Cardiovascular Disease

## 2019-05-06 DIAGNOSIS — I255 Ischemic cardiomyopathy: Secondary | ICD-10-CM | POA: Diagnosis present

## 2019-05-22 ENCOUNTER — Telehealth: Payer: Self-pay | Admitting: Interventional Cardiology

## 2019-05-22 NOTE — Telephone Encounter (Signed)
FMLA form received from [employer] at [place] bldg. Placed in box for Dr. Tamala Julian to review. 05/22/19 vlm

## 2019-05-31 ENCOUNTER — Other Ambulatory Visit: Payer: Self-pay | Admitting: Physician Assistant

## 2019-05-31 ENCOUNTER — Other Ambulatory Visit: Payer: Self-pay | Admitting: Nurse Practitioner

## 2019-09-05 ENCOUNTER — Emergency Department (HOSPITAL_BASED_OUTPATIENT_CLINIC_OR_DEPARTMENT_OTHER)
Admission: EM | Admit: 2019-09-05 | Discharge: 2019-09-05 | Disposition: A | Discharge status: Home / Self Care | Payer: 59 | Attending: Emergency Medicine | Admitting: Emergency Medicine

## 2019-09-05 ENCOUNTER — Encounter (HOSPITAL_BASED_OUTPATIENT_CLINIC_OR_DEPARTMENT_OTHER): Payer: Self-pay | Admitting: Emergency Medicine

## 2019-09-05 ENCOUNTER — Other Ambulatory Visit: Payer: Self-pay

## 2019-09-05 DIAGNOSIS — M79641 Pain in right hand: Secondary | ICD-10-CM | POA: Diagnosis present

## 2019-09-05 DIAGNOSIS — Z79899 Other long term (current) drug therapy: Secondary | ICD-10-CM | POA: Insufficient documentation

## 2019-09-05 DIAGNOSIS — M65841 Other synovitis and tenosynovitis, right hand: Secondary | ICD-10-CM | POA: Insufficient documentation

## 2019-09-05 DIAGNOSIS — Z7984 Long term (current) use of oral hypoglycemic drugs: Secondary | ICD-10-CM | POA: Insufficient documentation

## 2019-09-05 DIAGNOSIS — M659 Synovitis and tenosynovitis, unspecified: Secondary | ICD-10-CM

## 2019-09-05 DIAGNOSIS — I10 Essential (primary) hypertension: Secondary | ICD-10-CM | POA: Diagnosis not present

## 2019-09-05 DIAGNOSIS — E119 Type 2 diabetes mellitus without complications: Secondary | ICD-10-CM | POA: Insufficient documentation

## 2019-09-05 DIAGNOSIS — I2510 Atherosclerotic heart disease of native coronary artery without angina pectoris: Secondary | ICD-10-CM | POA: Insufficient documentation

## 2019-09-05 DIAGNOSIS — F17210 Nicotine dependence, cigarettes, uncomplicated: Secondary | ICD-10-CM | POA: Diagnosis not present

## 2019-09-05 NOTE — ED Provider Notes (Signed)
[facility] EM[facility]ENC[facility] DEPARTMENT Provider Note   CSN: [id number] Arrival date & time: 09/05/19  0703     History Chief Complaint  Patient presents with  . Hand Pain    Brooke Williamson is a 54 y.o. female.  54 yo F with a chief complaints of right hand pain.  Patient started work this week in a job where she does repetitive motion with her hands and feet.  Started to have pain and swelling to the base of the right thumb.  No obvious injury no fevers.  Worse with supination.  The history is provided by the patient.  Hand Pain This is a new problem. The current episode started 2 days ago. The problem occurs constantly. The problem has not changed since onset.Pertinent negatives include no chest pain, no abdominal pain, no headaches and no shortness of breath. The symptoms are aggravated by bending and twisting. Nothing relieves the symptoms. She has tried nothing for the symptoms. The treatment provided no relief.       Past Medical History:  Diagnosis Date  . CAD (coronary artery disease)    a. acute cath showed occlusion of large branching first obtuse marginal. She underwent Successful PCI and stent implantation in the proximal to mid first obtuse marginal. Distal embolization of thrombus was noted into the inferior most branch of the first obtuse marginal. LM, LAD, and RCA were normal. LVEF was 35-45% by cath 40-45% by echo.  . Diabetes mellitus type 2 in nonobese ([place])   . GERD (gastroesophageal reflux disease)   . Hypercholesteremia   . Hypertension   . Ischemic cardiomyopathy   . NSVT (nonsustained ventricular tachycardia) ([place])   . ST elevation myocardial infarction (STEMI) of inferior wall ([place]) 03/2018  . Thyroid disease   . Tobacco abuse   . Ulcer     Patient Active Problem List   Diagnosis Date Noted  . CAD in native artery 03/25/2018  . Ischemic cardiomyopathy 03/25/2018  . Abnormal TSH 03/25/2018  . Sinus tachycardia 03/25/2018  . Hypokalemia  03/25/2018  . NSVT (nonsustained ventricular tachycardia) ([place]) 03/25/2018  . Tobacco abuse 03/25/2018  . Diabetes mellitus type 2 in nonobese ([place]) 03/25/2018  . Acute ST elevation myocardial infarction (STEMI) of inferior wall ([place]) 03/23/2018  . Hypertension 03/23/2018    Past Surgical History:  Procedure Laterality Date  . CORONARY/GRAFT ACUTE MI REVASCULARIZATION N/A 03/23/2018   Procedure: Coronary/Graft Acute MI Revascularization;  Surgeon: Belva Crome, MD;  Location: [facility];  Service: Cardiovascular;  Laterality: N/A;  . LEFT HEART CATH AND CORONARY ANGIOGRAPHY N/A 03/23/2018   Procedure: LEFT HEART CATH AND CORONARY ANGIOGRAPHY;  Surgeon: Belva Crome, MD;  Location: [facility];  Service: Cardiovascular;  Laterality: N/A;  . THYROID SURGERY       OB History   No obstetric history on file.     Family History  Problem Relation Age of Onset  . High blood pressure Mother   . Heart attack Father   . Heart attack Brother        x 2 age 4 and 48/crack cocaine/in prison  . High blood pressure Brother   . Hyperlipidemia Brother     Social History   Tobacco Use  . Smoking status: Current Every Day Smoker    Types: Cigarettes  . Smokeless tobacco: Never Used  Vaping Use  . Vaping Use: Never used  Substance Use Topics  . Alcohol use: Not Currently    Comment: occ  .  Drug use: No    Home Medications Prior to Admission medications   Medication Sig Start Date End Date Taking? Authorizing Provider  carvedilol (COREG) 6.25 MG tablet TAKE 1 TABLET BY MOUTH TWICE DAILY 05/31/19   Burtis Junes, NP  clopidogrel (PLAVIX) 75 MG tablet Take 1 tablet (75 mg total) by mouth daily. 02/21/19   Belva Crome, MD  levothyroxine (SYNTHROID) 75 MCG tablet Take 75 mcg by mouth daily before breakfast.  11/22/17   [provider]  losartan (COZAAR) 25 MG tablet Take 0.5 tablets (12.5 mg total) by mouth daily. 02/21/19   Belva Crome, MD  metFORMIN  (GLUCOPHAGE) 500 MG tablet TAKE 1/2 TABLET BY MOUTH TWICE DAILY WITH A MEAL 05/30/18   Dunn, Nedra Hai, PA-C  nitroGLYCERIN (NITROSTAT) 0.4 MG SL tablet Place 1 tablet (0.4 mg total) under the tongue every 5 (five) minutes as needed for chest pain (Up to 3 doses. If taking 3rd dose call 911). 03/25/18 03/25/19  Dunn, Nedra Hai, PA-C  Omega-3 Fatty Acids (FISH OIL) 1000 MG CAPS Take 1,000 mg by mouth 2 (two) times daily.    [provider]  rosuvastatin (CRESTOR) 40 MG tablet Take 1 tablet (40 mg total) by mouth daily. 03/06/19 06/04/19  Belva Crome, MD  spironolactone (ALDACTONE) 25 MG tablet TAKE 1/2 TABLET BY MOUTH EVERY DAY 05/31/19   Dunn, Nedra Hai, PA-C    Allergies    Nitrofurantoin monohyd macro, Atorvastatin, Simvastatin, Ciprofloxacin, and Penicillins  Review of Systems   Review of Systems  Constitutional: Negative for chills and fever.  HENT: Negative for congestion and rhinorrhea.   Eyes: Negative for redness and visual disturbance.  Respiratory: Negative for shortness of breath and wheezing.   Cardiovascular: Negative for chest pain and palpitations.  Gastrointestinal: Negative for abdominal pain, nausea and vomiting.  Genitourinary: Negative for dysuria and urgency.  Musculoskeletal: Positive for arthralgias. Negative for myalgias.  Skin: Negative for pallor and wound.  Neurological: Negative for dizziness and headaches.    Physical Exam Updated Vital Signs BP 119/74 (BP Location: Left Arm)   Pulse 80   Temp 98.7 F (37.1 C) (Oral)   Resp 20   Ht 5' (1.524 m)   Wt 59.9 kg   LMP 03/29/2016   SpO2 100%   BMI 25.78 kg/m   Physical Exam Vitals and nursing note reviewed.  Constitutional:      General: She is not in acute distress.    Appearance: She is well-developed. She is not diaphoretic.  HENT:     Head: Normocephalic and atraumatic.  Eyes:     Pupils: Pupils are equal, round, and reactive to light.  Cardiovascular:     Rate and Rhythm: Normal rate and  regular rhythm.     Heart sounds: No murmur heard.  No friction rub. No gallop.   Pulmonary:     Effort: Pulmonary effort is normal.     Breath sounds: No wheezing or rales.  Abdominal:     General: There is no distension.     Palpations: Abdomen is soft.     Tenderness: There is no abdominal tenderness.  Musculoskeletal:        General: Tenderness present.     Cervical back: Normal range of motion and neck supple.     Comments: Tenderness and swelling to the base of the right thumb.  Full range of motion of the wrist without significant tenderness.  No obvious joint effusion.  Positive Finkelstein test.  Skin:  General: Skin is warm and dry.  Neurological:     Mental Status: She is alert and oriented to person, place, and time.  Psychiatric:        Behavior: Behavior normal.     ED Results / Procedures / Treatments   Labs (all labs ordered are listed, but only abnormal results are displayed) Labs Reviewed - No data to display  EKG None  Radiology No results found.  Procedures Procedures (including critical care time)  Medications Ordered in ED Medications - No data to display  ED Course  I have reviewed the triage vital signs and the nursing notes.  Pertinent labs & imaging results that were available during my care of the patient were reviewed by me and considered in my medical decision making (see chart for details).    MDM Rules/Calculators/A&P                          54 yo F with a chief complaints of right thumb pain.  Most likely this is de Quervain's tenosynovitis.  As it is nontraumatic I do not feel that imaging is warranted.  Unlikely to be is septic joints with no diffuse wrist edema or erythema and range of motion of the wrist without significant tenderness.  Will treat conservatively with splinting and Tylenol and NSAIDs.  PCP follow-up.  7:56 AM:  I have discussed the diagnosis/risks/treatment options with the patient and believe the pt to be  eligible for discharge home to follow-up with PCP. We also discussed returning to the ED immediately if new or worsening sx occur. We discussed the sx which are most concerning (e.g., sudden worsening pain, fever, inability to tolerate by mouth) that necessitate immediate return. Medications administered to the patient during their visit and any new prescriptions provided to the patient are listed below.  Medications given during this visit Medications - No data to display   The patient appears reasonably screen and/or stabilized for discharge and I doubt any other medical condition or other [facility] requiring further screening, evaluation, or treatment in the ED at this time prior to discharge.   Final Clinical Impression(s) / ED Diagnoses Final diagnoses:  Flexor tenosynovitis of thumb    Rx / DC Orders ED Discharge Orders    None       Deno Etienne, DO 09/05/19 [id number]

## 2019-09-05 NOTE — Discharge Instructions (Signed)
Take 4 over the counter ibuprofen tablets 3 times a day or 2 over-the-counter naproxen tablets twice a day for pain. Also take tylenol 1000mg(2 extra strength) four times a day.    

## 2019-09-05 NOTE — ED Triage Notes (Signed)
Pt states she started a new job on Monday.  Yesterday she states she started noticing a knot on her right wrist.  It caused some significant pain last night.  No known injuries.  Notes swollen area at right wrist.

## 2019-09-05 NOTE — ED Notes (Signed)
Pain to rt wrist after starting new job on Monday  Some swelling noted  Denies known inj

## 2019-09-15 ENCOUNTER — Other Ambulatory Visit: Payer: Self-pay

## 2019-09-15 ENCOUNTER — Encounter (HOSPITAL_BASED_OUTPATIENT_CLINIC_OR_DEPARTMENT_OTHER): Payer: Self-pay | Admitting: Emergency Medicine

## 2019-09-15 ENCOUNTER — Emergency Department (HOSPITAL_BASED_OUTPATIENT_CLINIC_OR_DEPARTMENT_OTHER)
Admission: EM | Admit: 2019-09-15 | Discharge: 2019-09-15 | Disposition: A | Discharge status: Home / Self Care | Payer: 59 | Attending: Emergency Medicine | Admitting: Emergency Medicine

## 2019-09-15 DIAGNOSIS — Z79899 Other long term (current) drug therapy: Secondary | ICD-10-CM | POA: Insufficient documentation

## 2019-09-15 DIAGNOSIS — I10 Essential (primary) hypertension: Secondary | ICD-10-CM | POA: Insufficient documentation

## 2019-09-15 DIAGNOSIS — S46811A Strain of other muscles, fascia and tendons at shoulder and upper arm level, right arm, initial encounter: Secondary | ICD-10-CM | POA: Diagnosis not present

## 2019-09-15 DIAGNOSIS — F17210 Nicotine dependence, cigarettes, uncomplicated: Secondary | ICD-10-CM | POA: Insufficient documentation

## 2019-09-15 DIAGNOSIS — Y9289 Other specified places as the place of occurrence of the external cause: Secondary | ICD-10-CM | POA: Insufficient documentation

## 2019-09-15 DIAGNOSIS — Z7901 Long term (current) use of anticoagulants: Secondary | ICD-10-CM | POA: Diagnosis not present

## 2019-09-15 DIAGNOSIS — I2510 Atherosclerotic heart disease of native coronary artery without angina pectoris: Secondary | ICD-10-CM | POA: Insufficient documentation

## 2019-09-15 DIAGNOSIS — X500XXA Overexertion from strenuous movement or load, initial encounter: Secondary | ICD-10-CM | POA: Insufficient documentation

## 2019-09-15 DIAGNOSIS — Y990 Civilian activity done for income or pay: Secondary | ICD-10-CM | POA: Insufficient documentation

## 2019-09-15 DIAGNOSIS — E119 Type 2 diabetes mellitus without complications: Secondary | ICD-10-CM | POA: Diagnosis not present

## 2019-09-15 DIAGNOSIS — Z7984 Long term (current) use of oral hypoglycemic drugs: Secondary | ICD-10-CM | POA: Diagnosis not present

## 2019-09-15 DIAGNOSIS — S46911A Strain of unspecified muscle, fascia and tendon at shoulder and upper arm level, right arm, initial encounter: Secondary | ICD-10-CM

## 2019-09-15 DIAGNOSIS — Y9389 Activity, other specified: Secondary | ICD-10-CM | POA: Diagnosis not present

## 2019-09-15 DIAGNOSIS — S46901A Unspecified injury of unspecified muscle, fascia and tendon at shoulder and upper arm level, right arm, initial encounter: Secondary | ICD-10-CM | POA: Diagnosis present

## 2019-09-15 MED ORDER — ACETAMINOPHEN 500 MG PO TABS
ORAL_TABLET | ORAL | Status: AC
  Administered 2019-09-15: 1000 mg via ORAL
  Filled 2019-09-15: qty 2

## 2019-09-15 MED ORDER — ACETAMINOPHEN 500 MG PO TABS: 1000.0000 mg | ORAL_TABLET | Freq: Once | ORAL | Status: AC

## 2019-09-15 NOTE — Discharge Instructions (Addendum)
You may use over-the-counter Motrin (Ibuprofen), Acetaminophen (Tylenol), topical muscle creams such as SalonPas, Icy Hot, Bengay, etc. Please stretch, apply heat, and have massage therapy for additional assistance. ° °

## 2019-09-15 NOTE — ED Provider Notes (Signed)
[facility] EMERGENCY DEPARTMENT Provider Note  CSN: [id number] Arrival date & time: 09/15/19 0347  Chief Complaint(s) Shoulder Pain  HPI Brooke Williamson is a 54 y.o. 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 Lifted a heavy box yesterday and felt the pain shortly after.   HPI  Past Medical History Past Medical History:  Diagnosis Date  . CAD (coronary artery disease)    a. acute cath showed occlusion of large branching first obtuse marginal. She underwent Successful PCI and stent implantation in the proximal to mid first obtuse marginal. Distal embolization of thrombus was noted into the inferior most branch of the first obtuse marginal. LM, LAD, and RCA were normal. LVEF was 35-45% by cath 40-45% by echo.  . Diabetes mellitus type 2 in nonobese ([place])   . GERD (gastroesophageal reflux disease)   . Hypercholesteremia   . Hypertension   . Ischemic cardiomyopathy   . NSVT (nonsustained ventricular tachycardia) ([place])   . ST elevation myocardial infarction (STEMI) of inferior wall ([place]) 03/2018  . Thyroid disease   . Tobacco abuse   . Ulcer    Patient Active Problem List   Diagnosis Date Noted  . CAD in native artery 03/25/2018  . Ischemic cardiomyopathy 03/25/2018  . Abnormal TSH 03/25/2018  . Sinus tachycardia 03/25/2018  . Hypokalemia 03/25/2018  . NSVT (nonsustained ventricular tachycardia) ([place]) 03/25/2018  . Tobacco abuse 03/25/2018  . Diabetes mellitus type  2 in nonobese ([place]) 03/25/2018  . Acute ST elevation myocardial infarction (STEMI) of inferior wall ([place]) 03/23/2018  . Hypertension 03/23/2018   Home Medication(s) Prior to Admission medications   Medication Sig Start Date End Date Taking? Authorizing Provider  carvedilol (COREG) 6.25 MG tablet TAKE 1 TABLET BY MOUTH TWICE DAILY 05/31/19   Burtis Junes, NP  clopidogrel (PLAVIX) 75 MG tablet Take 1 tablet (75 mg total) by mouth daily. 02/21/19   Belva Crome, MD  glipiZIDE (GLUCOTROL XL) 5 MG 24 hr tablet Take 5 mg by mouth daily. 09/07/19   [provider]  levothyroxine (SYNTHROID) 75 MCG tablet Take 75 mcg by mouth daily before breakfast.  11/22/17   [provider]  losartan (COZAAR) 25 MG tablet Take 0.5 tablets (12.5 mg total) by mouth daily. 02/21/19   Belva Crome, MD  metFORMIN (GLUCOPHAGE) 500 MG tablet TAKE 1/2 TABLET BY MOUTH TWICE DAILY WITH A MEAL 05/30/18   Dunn, Nedra Hai, PA-C  nitroGLYCERIN (NITROSTAT) 0.4 MG SL tablet Place 1 tablet (0.4 mg total) under the tongue every 5 (five) minutes as needed for chest pain (Up to 3 doses. If taking 3rd dose call 911). 03/25/18 03/25/19  Dunn, Nedra Hai, PA-C  Omega-3 Fatty Acids (FISH OIL) 1000 MG CAPS Take 1,000 mg by mouth 2 (two) times daily.    [provider]  rosuvastatin (CRESTOR) 40 MG tablet Take 1 tablet (40 mg total) by mouth daily. 03/06/19 06/04/19  Belva Crome, MD  spironolactone (ALDACTONE) 25 MG tablet TAKE 1/2 TABLET BY MOUTH EVERY DAY 05/31/19   Charlie Pitter, PA-C  Past Surgical History Past Surgical History:  Procedure Laterality Date  . CORONARY/GRAFT ACUTE MI REVASCULARIZATION N/A 03/23/2018   Procedure: Coronary/Graft Acute MI Revascularization;  Surgeon: Belva Crome, MD;  Location: [facility];  Service: Cardiovascular;  Laterality: N/A;  . LEFT HEART  CATH AND CORONARY ANGIOGRAPHY N/A 03/23/2018   Procedure: LEFT HEART CATH AND CORONARY ANGIOGRAPHY;  Surgeon: Belva Crome, MD;  Location: [facility];  Service: Cardiovascular;  Laterality: N/A;  . THYROID SURGERY     Family History Family History  Problem Relation Age of Onset  . High blood pressure Mother   . Heart attack Father   . Heart attack Brother        x 2 age 43 and 48/crack cocaine/in prison  . High blood pressure Brother   . Hyperlipidemia Brother     Social History Social History   Tobacco Use  . Smoking status: Current Every Day Smoker    Types: Cigarettes  . Smokeless tobacco: Never Used  Vaping Use  . Vaping Use: Never used  Substance Use Topics  . Alcohol use: Not Currently    Comment: occ  . Drug use: No   Allergies Nitrofurantoin monohyd macro, Atorvastatin, Simvastatin, Ciprofloxacin, and Penicillins  Review of Systems Review of Systems All other systems are reviewed and are negative for acute change except as noted in the HPI  Physical Exam Vital Signs  I have reviewed the triage vital signs BP 139/90   Pulse 68   Temp 97.8 F (36.6 C) (Oral)   Resp 20   Ht 5' (1.524 m)   Wt 59.9 kg   LMP 03/29/2016   SpO2 100%   BMI 25.78 kg/m   Physical Exam Vitals reviewed.  Constitutional:      General: She is not in acute distress.    Appearance: She is well-developed. She is not diaphoretic.  HENT:     Head: Normocephalic and atraumatic.     Right Ear: External ear normal.     Left Ear: External ear normal.     Nose: Nose normal.  Eyes:     General: No scleral icterus.    Conjunctiva/sclera: Conjunctivae normal.  Neck:     Trachea: Phonation normal.  Cardiovascular:     Rate and Rhythm: Normal rate and regular rhythm.  Pulmonary:     Effort: Pulmonary effort is normal. No respiratory distress.     Breath sounds: No stridor.  Abdominal:     General: There is no distension.  Musculoskeletal:     Right shoulder: No swelling,  deformity, bony tenderness or crepitus. Decreased range of motion (due to pain, but when ranged passively the pain resolved and she was able to actively range w/o pain). Normal strength. Normal pulse.     Cervical back: Normal range of motion.     Thoracic back: Tenderness present.       Back:  Neurological:     Mental Status: She is alert and oriented to person, place, and time.  Psychiatric:        Behavior: Behavior normal.     ED Results and Treatments Labs (all labs ordered are listed, but only abnormal results are displayed) Labs Reviewed - No data to display  EKG  EKG Interpretation  Date/Time:    Ventricular Rate:    PR Interval:    QRS Duration:   QT Interval:    QTC Calculation:   R Axis:     Text Interpretation:        Radiology No results found.  Pertinent labs & imaging results that were available during my care of the patient were reviewed by me and considered in my medical decision making (see chart for details).  Medications Ordered in ED Medications - No data to display                                                                                                                                  Procedures Procedures  (including critical care time)  Medical Decision Making / ED Course I have reviewed the nursing notes for this encounter and the patient's prior records (if available in EHR or on provided paperwork).   Brooke Williamson was evaluated in Emergency Department on 09/15/2019 for the symptoms described in the history of present illness. She was evaluated in the context of the global COVID-19 pandemic, which necessitated consideration that the patient might be at risk for infection with the SARS-CoV-2 virus that causes COVID-19. Institutional protocols and algorithms that pertain to the evaluation of patients at risk for COVID-19  are in a state of rapid change based on information released by regulatory bodies including the CDC and federal and state organizations. These policies and algorithms were followed during the patient's care in the ED.  Over use injury. Possible muscle strain vs shoulder impingement vs tendinitis No trauma or suspicion for dislocation requiring imaging. Symptomatic and supportive measures recommended      Final Clinical Impression(s) / ED Diagnoses Final diagnoses:  Strain of right shoulder, initial encounter   The patient appears reasonably screened and/or stabilized for discharge and I doubt any other medical condition or other [facility] requiring further screening, evaluation, or treatment in the ED at this time prior to discharge. Safe for discharge with strict return precautions.  Disposition: Discharge  Condition: Good  I have discussed the results, Dx and Tx plan with the patient/family who expressed understanding and agree(s) with the plan. Discharge instructions discussed at length. The patient/family was given strict return precautions who verbalized understanding of the instructions. No further questions at time of discharge.    ED Discharge Orders    None       Follow Up: Wynn Banker, PA [address] [place]  [id number] [phone]         This chart was dictated using voice recognition software.  Despite best efforts to proofread,  errors can occur which can change the documentation meaning.   Fatima Blank, MD 09/15/19 [phone]

## 2019-09-15 NOTE — ED Triage Notes (Signed)
Pt c/o right shoulder pain since yesterday.  Pt states she lifts a lot of boxes at work.

## 2020-01-13 ENCOUNTER — Telehealth: Payer: Self-pay | Admitting: Interventional Cardiology

## 2020-01-13 NOTE — Telephone Encounter (Signed)
     Pt said she have MRI scheduled tomorrow morning and the tech needs to know what kind of stent she have. She needs a callback today

## 2020-01-13 NOTE — Telephone Encounter (Signed)
Spoke with pt and asked if she still had her stent card.  Pt states she doesn't recall ever having one.  Advised, per cath report, she has a Medtronic DES.  Pt appreciative for call.

## 2020-01-14 NOTE — Telephone Encounter (Signed)
Rodena Piety with [facility] is following up. She would also like to know what type of stent the patient had placed. She states the patient made her aware that it was a Medtronic stent. However, she states she needs to know the specific type. Please call.  [phone]

## 2020-01-14 NOTE — Telephone Encounter (Signed)
Provided verbal stent info -MR compatibility,  to Rodena Piety and faxed info per her request at [phone] [facility].   Stent Resolute Onyx 2.75x15 - [id number] - Implanted Inventory item: Loma Sousa 4.16S06 Model/Cat number: [id number]  Manufacturer: [employer] Lot number: [id number]  Device identifier: [id number] Device identifier type: GS1  GUDID Information  Request status Successful    Brand name: Resolute OnyxT Version/Model: [id number]  Company name: [employer] MRI safety info as of 03/23/18: MR Conditional

## 2020-02-18 NOTE — Progress Notes (Signed)
Cardiology Office Note:    Date:  02/20/2020   ID:  Brooke Williamson, DOB 12-14-1965, MRN [id number]  PCP:  Wynn Banker, PA  Cardiologist:  Sinclair Grooms, MD   Referring MD: Wynn Banker*   Chief Complaint  Patient presents with  . Coronary Artery Disease  . Hyperlipidemia  . Hypertension  . Follow-up    Abnormal EKG    History of Present Illness:    Brooke Williamson is a 54 y.o. female with a hx of  HTN, HLD,DMII,tobacco abuse, GERD, PUD,&thyroid diseaseand recent lateral STEMItreated with DES1/2020  Doing great.  Recent laboratory data was outstanding needing.  He denies she has tachypalpitations about 30 minutes after taking her p.m. meds.  She wonders if rosuvastatin, omega-3 fatty acid, or carvedilol are contributing.  We discussed other possibilities of ventricular ectopy or atrial fibrillation which could have consequences.  With the palpitations that she feels right under the upper sternum and the base of her neck, she feels her heart fluttering but is not having shortness of breath, chest tightness, sweating, or other related symptoms.  Past Medical History:  Diagnosis Date  . CAD (coronary artery disease)    a. acute cath showed occlusion of large branching first obtuse marginal. She underwent Successful PCI and stent implantation in the proximal to mid first obtuse marginal. Distal embolization of thrombus was noted into the inferior most branch of the first obtuse marginal. LM, LAD, and RCA were normal. LVEF was 35-45% by cath 40-45% by echo.  . Diabetes mellitus type 2 in nonobese ([place])   . GERD (gastroesophageal reflux disease)   . Hypercholesteremia   . Hypertension   . Ischemic cardiomyopathy   . NSVT (nonsustained ventricular tachycardia) ([place])   . ST elevation myocardial infarction (STEMI) of inferior wall ([place]) 03/2018  . Thyroid disease   . Tobacco abuse   . Ulcer     Past Surgical History:  Procedure Laterality  Date  . CORONARY/GRAFT ACUTE MI REVASCULARIZATION N/A 03/23/2018   Procedure: Coronary/Graft Acute MI Revascularization;  Surgeon: Belva Crome, MD;  Location: [facility];  Service: Cardiovascular;  Laterality: N/A;  . LEFT HEART CATH AND CORONARY ANGIOGRAPHY N/A 03/23/2018   Procedure: LEFT HEART CATH AND CORONARY ANGIOGRAPHY;  Surgeon: Belva Crome, MD;  Location: [facility];  Service: Cardiovascular;  Laterality: N/A;  . THYROID SURGERY      Current Medications: Current Meds  Medication Sig  . glipiZIDE (GLUCOTROL XL) 5 MG 24 hr tablet Take 5 mg by mouth daily.  Marland Kitchen levothyroxine (SYNTHROID) 75 MCG tablet Take 75 mcg by mouth daily before breakfast.   . Omega-3 Fatty Acids (FISH OIL) 1000 MG CAPS Take 1,000 mg by mouth 2 (two) times daily.  . rosuvastatin (CRESTOR) 40 MG tablet Take 1 tablet (40 mg total) by mouth daily.  . [DISCONTINUED] carvedilol (COREG) 6.25 MG tablet TAKE 1 TABLET BY MOUTH TWICE DAILY  . [DISCONTINUED] losartan (COZAAR) 25 MG tablet Take 0.5 tablets (12.5 mg total) by mouth daily.  . [DISCONTINUED] nitroGLYCERIN (NITROSTAT) 0.4 MG SL tablet Place 1 tablet (0.4 mg total) under the tongue every 5 (five) minutes as needed for chest pain (Up to 3 doses. If taking 3rd dose call 911).  . [DISCONTINUED] spironolactone (ALDACTONE) 25 MG tablet TAKE 1/2 TABLET BY MOUTH EVERY DAY     Allergies:   Nitrofurantoin monohyd macro, Atorvastatin, Simvastatin, Ciprofloxacin, and Penicillins   Social History   Socioeconomic History  . Marital status:  Single    Spouse name: Not on file  . Number of children: Not on file  . Years of education: Not on file  . Highest education level: Not on file  Occupational History  . Not on file  Tobacco Use  . Smoking status: Current Every Day Smoker    Types: Cigarettes  . Smokeless tobacco: Never Used  Vaping Use  . Vaping Use: Never used  Substance and Sexual Activity  . Alcohol use: Not Currently    Comment: occ  .  Drug use: No  . Sexual activity: Not on file  Other Topics Concern  . Not on file  Social History Narrative  . Not on file   Social Determinants of Health   Financial Resource Strain: Not on file  Food Insecurity: Not on file  Transportation Needs: Not on file  Physical Activity: Not on file  Stress: Not on file  Social Connections: Not on file     Family History: The patient's family history includes Heart attack in her brother and father; High blood pressure in her brother and mother; Hyperlipidemia in her brother.  ROS:   Please see the history of present illness.    Has not smoked for 2 days and feels very anxious.  Palpitations occurring at night after she takes her medications.  All other systems reviewed and are negative.  EKGs/Labs/Other Studies Reviewed:    The following studies were reviewed today:  Most recent [facility]  12/23/2019: Hemoglobin A1c 6.2; LDL 71; triglyceride 74 (she independently initiated omega-3 fatty acid therapy).  2D Doppler echocardiogram 05/06/2019: IMPRESSIONS    1. Left ventricular ejection fraction, by estimation, is 40 to 45%. The  left ventricle has mildly decreased function. The left ventricle  demonstrates regional wall motion abnormalities (see scoring  diagram/findings for description). Left ventricular  diastolic parameters are consistent with Grade I diastolic dysfunction  (impaired relaxation).  2. Right ventricular systolic function is normal. The right ventricular  size is normal.  3. The mitral valve is normal in structure and function. Mild mitral  valve regurgitation. No evidence of mitral stenosis.  4. The aortic valve is normal in structure and function. Aortic valve  regurgitation is not visualized. No aortic stenosis is present.  5. The inferior vena cava is normal in size with greater than 50%  respiratory variability, suggesting right atrial pressure of 3 mmHg.   EKG:  EKG normal sinus rhythm, abnormal  EKG with inferolateral and lateral precordial symmetrical T wave inversion.  March 07, 2019 no changes occurred.   Recent Labs: 04/23/2019: ALT 24  Recent Lipid Panel    Component Value Date/Time   CHOL 133 04/23/2019 0850   TRIG 126 04/23/2019 0850   HDL 57 04/23/2019 0850   CHOLHDL 2.3 04/23/2019 0850   CHOLHDL 3.9 03/23/2018 0443   VLDL 6 03/23/2018 0443   LDLCALC 54 04/23/2019 0850    Physical Exam:    VS:  BP 124/86   Pulse 72   Ht 5' (1.524 m)   Wt 137 lb 3.2 oz (62.2 kg)   LMP 03/29/2016   BMI 26.80 kg/m     Wt Readings from Last 3 Encounters:  02/20/20 137 lb 3.2 oz (62.2 kg)  09/15/19 132 lb (59.9 kg)  09/05/19 132 lb (59.9 kg)     GEN: Appears healthy. No acute distress HEENT: Normal NECK: No JVD. LYMPHATICS: No lymphadenopathy CARDIAC:  RRR without murmur, gallop, or edema. VASCULAR:  Normal Pulses. No bruits. RESPIRATORY:  Clear to auscultation without rales, wheezing or rhonchi  ABDOMEN: Soft, non-tender, non-distended, No pulsatile mass, MUSCULOSKELETAL: No deformity  SKIN: Warm and dry NEUROLOGIC:  Alert and oriented x 3 PSYCHIATRIC:  Normal affect   ASSESSMENT:    1. S/P coronary artery stent placement   2. Other hyperlipidemia   3. Controlled type 2 diabetes mellitus with other circulatory complication, without long-term current use of insulin ([place])   4. Tobacco abuse   5. Educated about COVID-19 virus infection   6. Palpitations    PLAN:    In order of problems listed above:  1. Secondary prevention covered in detail. 2. LDL less than 70 as target.  Continue rosuvastatin 40 mg/day.  Untreated LDL is greater than 200.  Discussed plant-based diet.  Omega-3 fatty acids have decreased her triglycerides by 50%. 3. Hemoglobin A1c is 6.2 with dietary changes, exercise, and weight control 4. Says she stopped smoking 2 days ago and is about to go crazy.  I congratulated her for effort but also impressed upon her the significant impact that  smoking will have on her long-term prognosis in a negative way. 5. Vaccinated and boosted.  Overall education and awareness concerning secondary risk prevention was discussed in detail: LDL less than 70, hemoglobin A1c less than 7, blood pressure target less than 130/80 mmHg, >150 minutes of moderate aerobic activity per week, avoidance of smoking, weight control (via diet and exercise), and continued surveillance/management of/for obstructive sleep apnea.    Medication Adjustments/Labs and Tests Ordered: Current medicines are reviewed at length with the patient today.  Concerns regarding medicines are outlined above.  Orders Placed This Encounter  Procedures  . LONG TERM MONITOR (3-14 DAYS)  . EKG 12-Lead   Meds ordered this encounter  Medications  . nitroGLYCERIN (NITROSTAT) 0.4 MG SL tablet    Sig: Place 1 tablet (0.4 mg total) under the tongue every 5 (five) minutes as needed for chest pain (Up to 3 doses. If taking 3rd dose call 911).    Dispense:  25 tablet    Refill:  3  . carvedilol (COREG) 6.25 MG tablet    Sig: Take 1 tablet (6.25 mg total) by mouth 2 (two) times daily.    Dispense:  180 tablet    Refill:  3  . losartan (COZAAR) 25 MG tablet    Sig: Take 0.5 tablets (12.5 mg total) by mouth daily.    Dispense:  45 tablet    Refill:  3  . spironolactone (ALDACTONE) 25 MG tablet    Sig: Take 0.5 tablets (12.5 mg total) by mouth daily.    Dispense:  45 tablet    Refill:  3    Patient Instructions  Medication Instructions:  Your physician recommends that you continue on your current medications as directed. Please refer to the Current Medication list given to you today.  *If you need a refill on your cardiac medications before your next appointment, please call your pharmacy*   Lab Work: None If you have labs (blood work) drawn today and your tests are completely normal, you will receive your results only by: Marland Kitchen MyChart Message (if you have MyChart) OR . A paper copy  in the mail If you have any lab test that is abnormal or we need to change your treatment, we will call you to review the results.   Testing/Procedures: Your physician recommends that you wear a monitor for 7 days.   Follow-Up: At [facility], you and your health needs are our  priority.  As part of our continuing mission to provide you with exceptional heart care, we have created designated Provider Care Teams.  These Care Teams include your primary Cardiologist (physician) and Advanced Practice Providers (APPs -  Physician Assistants and Nurse Practitioners) who all work together to provide you with the care you need, when you need it.  We recommend signing up for the patient portal called "MyChart".  Sign up information is provided on this After Visit Summary.  MyChart is used to connect with patients for Virtual Visits (Telemedicine).  Patients are able to view lab/test results, encounter notes, upcoming appointments, etc.  Non-urgent messages can be sent to your provider as well.   To learn more about what you can do with MyChart, go to [email or web address].    Your next appointment:   1 year(s)  The format for your next appointment:   In Person  Provider:   You may see Sinclair Grooms, MD or one of the following Advanced Practice Providers on your designated Care Team:    Truitt Merle, NP  Cecilie Kicks, NP  Kathyrn Drown, NP    Other Instructions  [place] Monitor Instructions   Your physician has requested you wear your ZIO patch monitor 7 days.   This is a single patch monitor.  [employer] supplies one patch monitor per enrollment.  Additional stickers are not available.   Please do not apply patch if you will be having a Nuclear Stress Test, Echocardiogram, Cardiac CT, MRI, or Chest Xray during the time frame you would be wearing the monitor. The patch cannot be worn during these tests.  You cannot remove and re-apply the ZIO XT patch monitor.   Your  ZIO patch monitor will be sent USPS Priority mail from [facility] directly to your home address. The monitor may also be mailed to a PO BOX if home delivery is not available.   It may take 3-5 days to receive your monitor after you have been enrolled.   Once you have received you monitor, please review enclosed instructions.  Your monitor has already been registered assigning a specific monitor serial # to you.   Applying the monitor   Shave hair from upper left chest.   Hold abrader disc by orange tab.  Rub abrader in 40 strokes over left upper chest as indicated in your monitor instructions.   Clean area with 4 enclosed alcohol pads .  Use all pads to assure are is cleaned thoroughly.  Let dry.   Apply patch as indicated in monitor instructions.  Patch will be place under collarbone on left side of chest with arrow pointing upward.   Rub patch adhesive wings for 2 minutes.Remove white label marked "1".  Remove white label marked "2".  Rub patch adhesive wings for 2 additional minutes.   While looking in a mirror, press and release button in center of patch.  A small green light will flash 3-4 times .  This will be your only indicator the monitor has been turned on.     Do not shower for the first 24 hours.  You may shower after the first 24 hours.   Press button if you feel a symptom. You will hear a small click.  Record Date, Time and Symptom in the Patient Log Book.   When you are ready to remove patch, follow instructions on last 2 pages of Patient Log Book.  Stick patch monitor onto last page of Patient Log Book.  Place Patient Log Book in [place] box.  Use locking tab on box and tape box closed securely.  The [employer] and [employer] has [employer] on it.  Please place in mailbox as soon as possible.  Your physician should have your test results approximately 7 days after the monitor has been mailed back to [facility].   Call [place] at [phone]  if you have questions regarding your ZIO XT patch monitor.  Call them immediately if you see an orange light blinking on your monitor.   If your monitor falls off in less than 4 days contact our Monitor department at [phone].  If your monitor becomes loose or falls off after 4 days call [employer] at [phone] for suggestions on securing your monitor.       Signed, Sinclair Grooms, MD  02/20/2020 10:26 AM    [place]

## 2020-02-20 ENCOUNTER — Other Ambulatory Visit: Payer: Self-pay

## 2020-02-20 ENCOUNTER — Encounter: Payer: Self-pay | Admitting: Interventional Cardiology

## 2020-02-20 ENCOUNTER — Ambulatory Visit (INDEPENDENT_AMBULATORY_CARE_PROVIDER_SITE_OTHER): Payer: 59 | Admitting: Interventional Cardiology

## 2020-02-20 ENCOUNTER — Telehealth: Payer: Self-pay | Admitting: Radiology

## 2020-02-20 VITALS — BP 124/86 | HR 72 | Ht 60.0 in | Wt 137.2 lb

## 2020-02-20 DIAGNOSIS — Z720 Tobacco use: Secondary | ICD-10-CM | POA: Diagnosis not present

## 2020-02-20 DIAGNOSIS — E1159 Type 2 diabetes mellitus with other circulatory complications: Secondary | ICD-10-CM | POA: Diagnosis not present

## 2020-02-20 DIAGNOSIS — R002 Palpitations: Secondary | ICD-10-CM

## 2020-02-20 DIAGNOSIS — Z7189 Other specified counseling: Secondary | ICD-10-CM

## 2020-02-20 DIAGNOSIS — Z955 Presence of coronary angioplasty implant and graft: Secondary | ICD-10-CM | POA: Diagnosis not present

## 2020-02-20 DIAGNOSIS — E7849 Other hyperlipidemia: Secondary | ICD-10-CM | POA: Diagnosis not present

## 2020-02-20 MED ORDER — NITROGLYCERIN 0.4 MG SL SUBL: 0.4000 mg | SUBLINGUAL_TABLET | SUBLINGUAL | 3 refills | Status: AC | PRN

## 2020-02-20 MED ORDER — CARVEDILOL 6.25 MG PO TABS: 6.2500 mg | ORAL_TABLET | Freq: Two times a day (BID) | ORAL | 3 refills | Status: DC

## 2020-02-20 MED ORDER — LOSARTAN POTASSIUM 25 MG PO TABS: 12.5000 mg | ORAL_TABLET | Freq: Every day | ORAL | 3 refills | Status: DC

## 2020-02-20 MED ORDER — SPIRONOLACTONE 25 MG PO TABS: 12.5000 mg | ORAL_TABLET | Freq: Every day | ORAL | 3 refills | Status: DC

## 2020-02-20 NOTE — Telephone Encounter (Signed)
Enrolled patient for a 7 day Zio XT Monitor to be mailed to patients home.  

## 2020-02-20 NOTE — Patient Instructions (Signed)
Medication Instructions:  Your physician recommends that you continue on your current medications as directed. Please refer to the Current Medication list given to you today.  *If you need a refill on your cardiac medications before your next appointment, please call your pharmacy*   Lab Work: None If you have labs (blood work) drawn today and your tests are completely normal, you will receive your results only by: Marland Kitchen MyChart Message (if you have MyChart) OR . A paper copy in the mail If you have any lab test that is abnormal or we need to change your treatment, we will call you to review the results.   Testing/Procedures: Your physician recommends that you wear a monitor for 7 days.   Follow-Up: At [facility], you and your health needs are our priority.  As part of our continuing mission to provide you with exceptional heart care, we have created designated Provider Care Teams.  These Care Teams include your primary Cardiologist (physician) and Advanced Practice Providers (APPs -  Physician Assistants and Nurse Practitioners) who all work together to provide you with the care you need, when you need it.  We recommend signing up for the patient portal called "MyChart".  Sign up information is provided on this After Visit Summary.  MyChart is used to connect with patients for Virtual Visits (Telemedicine).  Patients are able to view lab/test results, encounter notes, upcoming appointments, etc.  Non-urgent messages can be sent to your provider as well.   To learn more about what you can do with MyChart, go to [email or web address].    Your next appointment:   1 year(s)  The format for your next appointment:   In Person  Provider:   You may see Sinclair Grooms, MD or one of the following Advanced Practice Providers on your designated Care Team:    Truitt Merle, NP  Cecilie Kicks, NP  Kathyrn Drown, NP    Other Instructions  [place] Monitor Instructions    Your physician has requested you wear your ZIO patch monitor 7 days.   This is a single patch monitor.  [employer] supplies one patch monitor per enrollment.  Additional stickers are not available.   Please do not apply patch if you will be having a Nuclear Stress Test, Echocardiogram, Cardiac CT, MRI, or Chest Xray during the time frame you would be wearing the monitor. The patch cannot be worn during these tests.  You cannot remove and re-apply the ZIO XT patch monitor.   Your ZIO patch monitor will be sent USPS Priority mail from [facility] directly to your home address. The monitor may also be mailed to a PO BOX if home delivery is not available.   It may take 3-5 days to receive your monitor after you have been enrolled.   Once you have received you monitor, please review enclosed instructions.  Your monitor has already been registered assigning a specific monitor serial # to you.   Applying the monitor   Shave hair from upper left chest.   Hold abrader disc by orange tab.  Rub abrader in 40 strokes over left upper chest as indicated in your monitor instructions.   Clean area with 4 enclosed alcohol pads .  Use all pads to assure are is cleaned thoroughly.  Let dry.   Apply patch as indicated in monitor instructions.  Patch will be place under collarbone on left side of chest with arrow pointing upward.   Rub patch adhesive wings for  2 minutes.Remove white label marked "1".  Remove white label marked "2".  Rub patch adhesive wings for 2 additional minutes.   While looking in a mirror, press and release button in center of patch.  A small green light will flash 3-4 times .  This will be your only indicator the monitor has been turned on.     Do not shower for the first 24 hours.  You may shower after the first 24 hours.   Press button if you feel a symptom. You will hear a small click.  Record Date, Time and Symptom in the Patient Log Book.   When you are ready to remove  patch, follow instructions on last 2 pages of Patient Log Book.  Stick patch monitor onto last page of Patient Log Book.   Place Patient Log Book in [place] box.  Use locking tab on box and tape box closed securely.  The [employer] and [employer] has [employer] on it.  Please place in mailbox as soon as possible.  Your physician should have your test results approximately 7 days after the monitor has been mailed back to [facility].   Call [place] at [phone] if you have questions regarding your ZIO XT patch monitor.  Call them immediately if you see an orange light blinking on your monitor.   If your monitor falls off in less than 4 days contact our Monitor department at [phone].  If your monitor becomes loose or falls off after 4 days call [employer] at [phone] for suggestions on securing your monitor.

## 2020-03-16 ENCOUNTER — Ambulatory Visit (INDEPENDENT_AMBULATORY_CARE_PROVIDER_SITE_OTHER): Payer: 59

## 2020-03-16 DIAGNOSIS — R002 Palpitations: Secondary | ICD-10-CM

## 2020-05-26 ENCOUNTER — Other Ambulatory Visit: Payer: Self-pay

## 2020-05-26 ENCOUNTER — Encounter: Payer: Self-pay | Admitting: Interventional Cardiology

## 2020-05-26 MED ORDER — ROSUVASTATIN CALCIUM 40 MG PO TABS: 40.0000 mg | ORAL_TABLET | Freq: Every day | ORAL | 2 refills | Status: DC

## 2020-05-26 NOTE — Telephone Encounter (Signed)
This encounter was created in error - please disregard.

## 2020-09-02 ENCOUNTER — Telehealth: Payer: Self-pay

## 2020-09-02 NOTE — Telephone Encounter (Signed)
Pt calling requesting a refill on clopidogrel. This medication was D/C off of pt's medication list. Pt states that she still takes this medication and would like Dr. Tamala Julian nurse to give her a call concerning this matter. Please address

## 2020-09-04 ENCOUNTER — Telehealth: Payer: Self-pay | Admitting: *Deleted

## 2020-09-04 NOTE — Telephone Encounter (Signed)
Patient called the office asking if she should continue taking "Clopidogrel."  Patient was advised that a message is in awaiting an answer from the nurse. The nurse will address the message when she returns to the office. Patient verbalized understanding.

## 2020-09-07 NOTE — Telephone Encounter (Signed)
Left message to call back  

## 2020-09-09 NOTE — Telephone Encounter (Signed)
Pt states that when she called in, someone told her that she was not supposed to be on the Plavix anymore.  Advised pt I was unable to locate where she was told to stop it.  I do see that it was removed from her medication list.  We did tell pt to stop ASA, but not Plavix.  Pt states she thought she remembered Dr. Tamala Julian possibly mentioning that she could stop it, but wasn't totally sure.  Advised I will send message to Dr. Tamala Julian to see if he feels like pt needs to continue Plavix.

## 2020-09-09 NOTE — Telephone Encounter (Signed)
Left message to call back  

## 2020-09-10 NOTE — Telephone Encounter (Signed)
Continue Plavix 75 mg daily if she still smokes.

## 2020-09-11 MED ORDER — CLOPIDOGREL BISULFATE 75 MG PO TABS: 75.0000 mg | ORAL_TABLET | Freq: Every day | ORAL | 3 refills | Status: DC

## 2020-09-11 NOTE — Telephone Encounter (Signed)
Clarified with Dr. Tamala Julian and he said pt should continue Plavix either way, but especially if she is still smoking.  Called pt and left message to call back.  Prescription sent to pharmacy.

## 2020-09-18 ENCOUNTER — Telehealth: Payer: Self-pay | Admitting: Interventional Cardiology

## 2020-09-18 NOTE — Telephone Encounter (Signed)
Left message to call back  

## 2020-09-18 NOTE — Telephone Encounter (Signed)
See previous phone note.  

## 2020-09-18 NOTE — Telephone Encounter (Signed)
Spoke with pt and she is on her way to the pharmacy to pick up her Plavix now.  Pt appreciative for call.

## 2020-09-18 NOTE — Telephone Encounter (Signed)
Pt is returning call from earlier today. Please advise pt further 

## 2021-02-03 ENCOUNTER — Other Ambulatory Visit: Payer: Self-pay | Admitting: *Deleted

## 2021-02-03 MED ORDER — SPIRONOLACTONE 25 MG PO TABS
12.5000 mg | ORAL_TABLET | Freq: Every day | ORAL | 0 refills | Status: DC
Start: 2021-02-03 — End: 2021-04-02

## 2021-02-03 MED ORDER — CARVEDILOL 6.25 MG PO TABS: 6.2500 mg | ORAL_TABLET | Freq: Two times a day (BID) | ORAL | 0 refills | Status: DC

## 2021-02-03 NOTE — Addendum Note (Signed)
Addended by: Gaetano Net on: 02/03/2021 07:33 AM   Modules accepted: Orders

## 2021-02-15 ENCOUNTER — Other Ambulatory Visit: Payer: Self-pay | Admitting: Interventional Cardiology

## 2021-03-26 ENCOUNTER — Other Ambulatory Visit: Payer: Self-pay

## 2021-03-26 ENCOUNTER — Ambulatory Visit (INDEPENDENT_AMBULATORY_CARE_PROVIDER_SITE_OTHER): Payer: BC Managed Care – PPO | Admitting: Interventional Cardiology

## 2021-03-26 ENCOUNTER — Encounter: Payer: Self-pay | Admitting: Interventional Cardiology

## 2021-03-26 VITALS — BP 118/80 | HR 69 | Ht 60.0 in | Wt 150.4 lb

## 2021-03-26 DIAGNOSIS — Z720 Tobacco use: Secondary | ICD-10-CM

## 2021-03-26 DIAGNOSIS — E7849 Other hyperlipidemia: Secondary | ICD-10-CM | POA: Diagnosis not present

## 2021-03-26 DIAGNOSIS — Z955 Presence of coronary angioplasty implant and graft: Secondary | ICD-10-CM

## 2021-03-26 DIAGNOSIS — E1159 Type 2 diabetes mellitus with other circulatory complications: Secondary | ICD-10-CM | POA: Diagnosis not present

## 2021-03-26 DIAGNOSIS — R002 Palpitations: Secondary | ICD-10-CM

## 2021-03-26 NOTE — Progress Notes (Signed)
Cardiology Office Note:    Date:  03/26/2021   ID:  Brooke Williamson, DOB 1965-06-27, MRN [id number]  PCP:  Wynn Banker, PA  Cardiologist:  Sinclair Grooms, MD   Referring MD: Wynn Banker*   Chief Complaint  Patient presents with   Advice Only    CAD   Congestive Heart Failure    Diastolic    History of Present Illness:    Brooke Williamson is a 56 y.o. female with a hx of HTN, HLD, DM II, tobacco abuse, GERD, PUD, & thyroid disease and recent lateral STEMI treated with DES 03/2018   She is doing well.  She denies angina.  Continues to smoke cigarettes.  She is compliant with her medication regimen.  She has gained 10 pounds.  She does not exercise on a regular basis.  We discussed smoking cessation and spent 5 minutes in considering strategies.  She is not willing to commit at this time although understands the importance of eventual discontinuation.  Past Medical History:  Diagnosis Date   CAD (coronary artery disease)    a. acute cath showed occlusion of large branching first obtuse marginal. She underwent Successful PCI and stent implantation in the proximal to mid first obtuse marginal. Distal embolization of thrombus was noted into the inferior most branch of the first obtuse marginal. LM, LAD, and RCA were normal. LVEF was 35-45% by cath 40-45% by echo.   Diabetes mellitus type 2 in nonobese [facility])    GERD (gastroesophageal reflux disease)    Hypercholesteremia    Hypertension    Ischemic cardiomyopathy    NSVT (nonsustained ventricular tachycardia)    ST elevation myocardial infarction (STEMI) of inferior wall ([place]) 03/2018   Thyroid disease    Tobacco abuse    Ulcer     Past Surgical History:  Procedure Laterality Date   CORONARY/GRAFT ACUTE MI REVASCULARIZATION N/A 03/23/2018   Procedure: Coronary/Graft Acute MI Revascularization;  Surgeon: Belva Crome, MD;  Location: [place] [facility];  Service: Cardiovascular;  Laterality: N/A;    LEFT HEART CATH AND CORONARY ANGIOGRAPHY N/A 03/23/2018   Procedure: LEFT HEART CATH AND CORONARY ANGIOGRAPHY;  Surgeon: Belva Crome, MD;  Location: [place] [facility];  Service: Cardiovascular;  Laterality: N/A;   THYROID SURGERY      Current Medications: Current Meds  Medication Sig   carvedilol (COREG) 6.25 MG tablet Take 1 tablet (6.25 mg total) by mouth 2 (two) times daily.   clopidogrel (PLAVIX) 75 MG tablet Take 1 tablet (75 mg total) by mouth daily.   glipiZIDE (GLUCOTROL XL) 5 MG 24 hr tablet Take 5 mg by mouth daily.   levothyroxine (SYNTHROID) 75 MCG tablet Take 75 mcg by mouth daily before breakfast.    losartan (COZAAR) 25 MG tablet Take 0.5 tablets (12.5 mg total) by mouth daily.   Omega-3 Fatty Acids (FISH OIL) 1000 MG CAPS Take 1,000 mg by mouth 2 (two) times daily.   rosuvastatin (CRESTOR) 40 MG tablet TAKE 1 TABLET(40 MG) BY MOUTH DAILY   spironolactone (ALDACTONE) 25 MG tablet Take 0.5 tablets (12.5 mg total) by mouth daily.   [DISCONTINUED] azelastine (ASTELIN) 0.1 % nasal spray 2 sprays as needed.     Allergies:   Nitrofurantoin monohyd macro, Atorvastatin, Simvastatin, Ciprofloxacin, and Penicillins   Social History   Socioeconomic History   Marital status: Single    Spouse name: Not on file   Number of children: Not on file   Years of education: Not  on file   Highest education level: Not on file  Occupational History   Not on file  Tobacco Use   Smoking status: Every Day    Types: Cigarettes   Smokeless tobacco: Never  Vaping Use   Vaping Use: Never used  Substance and Sexual Activity   Alcohol use: Not Currently    Comment: occ   Drug use: No   Sexual activity: Not on file  Other Topics Concern   Not on file  Social History Narrative   Not on file   Social Determinants of Health   Financial Resource Strain: Not on file  Food Insecurity: Not on file  Transportation Needs: Not on file  Physical Activity: Not on file  Stress: Not on file   Social Connections: Not on file     Family History: The patient's family history includes Heart attack in her brother and father; High blood pressure in her brother and mother; Hyperlipidemia in her brother.  ROS:   Please see the history of present illness.    Nothing other than as noted above.  All other systems reviewed and are negative.  EKGs/Labs/Other Studies Reviewed:    The following studies were reviewed today:\ 2D Doppler echocardiogram 2021: IMPRESSIONS     1. Left ventricular ejection fraction, by estimation, is 40 to 45%. The  left ventricle has mildly decreased function. The left ventricle  demonstrates regional wall motion abnormalities (see scoring  diagram/findings for description). Left ventricular  diastolic parameters are consistent with Grade I diastolic dysfunction  (impaired relaxation).   2. Right ventricular systolic function is normal. The right ventricular  size is normal.   3. The mitral valve is normal in structure and function. Mild mitral  valve regurgitation. No evidence of mitral stenosis.   4. The aortic valve is normal in structure and function. Aortic valve  regurgitation is not visualized. No aortic stenosis is present.   5. The inferior vena cava is normal in size with greater than 50%  respiratory variability, suggesting right atrial pressure of 3 mmHg.   Coronary angiography/PCI January 2020: Intervention   Long-term monitor 2022  Study Highlights    Basic rhythm is NSR with Ave HR 83 bpm (range 55-146 bpm) Brief SVT lating 4 beats at 146 bpm Rare PAC's, and PVC's with burden < 1% No atrial fibrillation Poor corelation between triggered events and arrhythmia. On 1 occasion an isolated PVC was noted.   Overall, benign study with no action items. EKG:  EKG when compared to 02/20/2020, today's EKG demonstrates no significant change.  There is evidence of an inferolateral infarction with Q waves 2, 3, aVF, V5 and V6 with inverted T  waves.  Recent Labs: No results found for requested labs within last 8760 hours.  Recent Lipid Panel    Component Value Date/Time   CHOL 133 04/23/2019 0850   TRIG 126 04/23/2019 0850   HDL 57 04/23/2019 0850   CHOLHDL 2.3 04/23/2019 0850   CHOLHDL 3.9 03/23/2018 0443   VLDL 6 03/23/2018 0443   LDLCALC 54 04/23/2019 0850    Physical Exam:    VS:  BP 118/80    Pulse 69    Ht 5' (1.524 m)    Wt 150 lb 6.4 oz (68.2 kg)    LMP 03/29/2016    BMI 29.37 kg/m     Wt Readings from Last 3 Encounters:  03/26/21 150 lb 6.4 oz (68.2 kg)  02/20/20 137 lb 3.2 oz (62.2 kg)  09/15/19 132 lb (  59.9 kg)     GEN: Overweight. No acute distress HEENT: Normal NECK: No JVD. LYMPHATICS: No lymphadenopathy CARDIAC: No murmur. RRR no gallop, or edema. VASCULAR:  Normal Pulses. No bruits. RESPIRATORY:  Clear to auscultation without rales, wheezing or rhonchi  ABDOMEN: Soft, non-tender, non-distended, No pulsatile mass, MUSCULOSKELETAL: No deformity  SKIN: Warm and dry NEUROLOGIC:  Alert and oriented x 3 PSYCHIATRIC:  Normal affect   ASSESSMENT:    1. S/P coronary artery stent placement   2. Controlled type 2 diabetes mellitus with other circulatory complication, without long-term current use of insulin (HCC)   3. Other hyperlipidemia   4. Tobacco abuse   5. Palpitations    PLAN:    In order of problems listed above:  Single-vessel coronary disease treated with PCI during STEMI in 2020.  Asymptomatic at this time.  Secondary risk prevention reviewed. Given coronary disease, risk factors, diastolic dysfunction on echo, and SGLT2 such as [place] or Jardiance would be an excellent addition and are switched out for glipizide. Continue high intensity statin therapy.  LDL target less than 70. Encourage smoking cessation.  Not yet ready to commit. None.  Overall education and awareness concerning secondary risk prevention was discussed in detail: LDL less than 70, hemoglobin A1c less than 7,  blood pressure target less than 130/80 mmHg, >150 minutes of moderate aerobic activity per week, avoidance of smoking, weight control (via diet and exercise), and continued surveillance/management of/for obstructive sleep apnea.    Medication Adjustments/Labs and Tests Ordered: Current medicines are reviewed at length with the patient today.  Concerns regarding medicines are outlined above.  Orders Placed This Encounter  Procedures   EKG 12-Lead   No orders of the defined types were placed in this encounter.   Patient Instructions  Medication Instructions:  Your physician recommends that you continue on your current medications as directed. Please refer to the Current Medication list given to you today.  *If you need a refill on your cardiac medications before your next appointment, please call your pharmacy*   Lab Work: None If you have labs (blood work) drawn today and your tests are completely normal, you will receive your results only by: [place] (if you have MyChart) OR A paper copy in the mail If you have any lab test that is abnormal or we need to change your treatment, we will call you to review the results.   Testing/Procedures: None   Follow-Up: At [facility], you and your health needs are our priority.  As part of our continuing mission to provide you with exceptional heart care, we have created designated Provider Care Teams.  These Care Teams include your primary Cardiologist (physician) and Advanced Practice Providers (APPs -  Physician Assistants and Nurse Practitioners) who all work together to provide you with the care you need, when you need it.  We recommend signing up for the patient portal called "MyChart".  Sign up information is provided on this After Visit Summary.  MyChart is used to connect with patients for Virtual Visits (Telemedicine).  Patients are able to view lab/test results, encounter notes, upcoming appointments, etc.  Non-urgent  messages can be sent to your provider as well.   To learn more about what you can do with MyChart, go to [email or web address].    Your next appointment:   1 year(s)  The format for your next appointment:   In Person  Provider:   Sinclair Grooms, MD     Other Instructions  Signed, Sinclair Grooms, MD  03/26/2021 9:34 AM    [place]

## 2021-03-26 NOTE — Patient Instructions (Signed)

## 2021-04-02 ENCOUNTER — Other Ambulatory Visit: Payer: Self-pay

## 2021-04-02 MED ORDER — SPIRONOLACTONE 25 MG PO TABS: 12.5000 mg | ORAL_TABLET | Freq: Every day | ORAL | 3 refills | Status: DC

## 2021-05-31 ENCOUNTER — Other Ambulatory Visit: Payer: Self-pay | Admitting: Interventional Cardiology

## 2021-05-31 ENCOUNTER — Telehealth: Payer: Self-pay | Admitting: Interventional Cardiology

## 2021-05-31 NOTE — Telephone Encounter (Signed)
?*  STAT* If patient is at the pharmacy, call can be transferred to refill team. ? ? ?1. Which medications need to be refilled? (please list name of each medication and dose if known) carvedilol (COREG) 6.25 MG tablet ? ?2. Which pharmacy/location (including street and city if local pharmacy) is medication to be sent to? [id number] - [place], [place] - [address] ? ?3. Do they need a 30 day or 90 day supply? 90 ? ?

## 2021-05-31 NOTE — Telephone Encounter (Signed)
Pt's medication has already been sent to pt's pharmacy as requested. Confirmation received.  

## 2021-08-23 ENCOUNTER — Other Ambulatory Visit: Payer: Self-pay

## 2021-08-23 MED ORDER — CLOPIDOGREL BISULFATE 75 MG PO TABS: 75.0000 mg | ORAL_TABLET | Freq: Every day | ORAL | 2 refills | Status: DC

## 2022-02-08 ENCOUNTER — Telehealth: Payer: Self-pay | Admitting: Interventional Cardiology

## 2022-02-08 NOTE — Progress Notes (Unsigned)
Cardiology Office Note:    Date:  02/10/2022   ID:  Brooke Williamson, DOB 03/27/1965, MRN [id number]  PCP:  Wynn Banker, PA  Cardiologist:  Sinclair Grooms, MD   Referring MD: Wynn Banker*   Chief Complaint  Patient presents with   Coronary Artery Disease   Congestive Heart Failure    Systolic and diastolic    History of Present Illness:    Brooke Williamson is a 56 y.o. female with a hx of HTN, HLD, DM II, systolic and diastolic heart failure (LVEF 40 to 45% 2021), tobacco abuse, GERD, PUD, & thyroid disease and remote lateral STEMI treated with DES 03/2018    Overall doing well.  Kidney impairment was felt to be related to glipizide and it has been discontinued.  Primary care has started [place] but she has been unable to get it through her pharmacy.  Most recent EF 40 to 45% 2021.  She is on an ARB in the form of Cozaar 12.5 mg/day, she takes Aldactone 12.5 mg/day and is on carvedilol 6.25 mg twice daily.  She denies palpitations, angina, and shortness of breath.  She still smokes 2 to 4 cigarettes/day.  Past Medical History:  Diagnosis Date   CAD (coronary artery disease)    a. acute cath showed occlusion of large branching first obtuse marginal. She underwent Successful PCI and stent implantation in the proximal to mid first obtuse marginal. Distal embolization of thrombus was noted into the inferior most branch of the first obtuse marginal. LM, LAD, and RCA were normal. LVEF was 35-45% by cath 40-45% by echo.   Diabetes mellitus type 2 in nonobese [facility])    GERD (gastroesophageal reflux disease)    Hypercholesteremia    Hypertension    Ischemic cardiomyopathy    NSVT (nonsustained ventricular tachycardia) (HCC)    ST elevation myocardial infarction (STEMI) of inferior wall ([place]) 03/2018   Thyroid disease    Tobacco abuse    Ulcer     Past Surgical History:  Procedure Laterality Date   CORONARY/GRAFT ACUTE MI REVASCULARIZATION N/A  03/23/2018   Procedure: Coronary/Graft Acute MI Revascularization;  Surgeon: Belva Crome, MD;  Location: [place] [facility];  Service: Cardiovascular;  Laterality: N/A;   LEFT HEART CATH AND CORONARY ANGIOGRAPHY N/A 03/23/2018   Procedure: LEFT HEART CATH AND CORONARY ANGIOGRAPHY;  Surgeon: Belva Crome, MD;  Location: [place] [facility];  Service: Cardiovascular;  Laterality: N/A;   THYROID SURGERY      Current Medications: Current Meds  Medication Sig   cetirizine (ZYRTEC) 10 MG tablet Take 10 mg by mouth as needed.   clopidogrel (PLAVIX) 75 MG tablet Take 1 tablet (75 mg total) by mouth daily.   dapagliflozin propanediol (FARXIGA) 10 MG TABS tablet Take 1 tablet (10 mg total) by mouth daily before breakfast.   fluticasone (FLONASE) 50 MCG/ACT nasal spray Place 1 spray into both nostrils as needed for rhinitis or allergies.   levothyroxine (SYNTHROID) 75 MCG tablet Take 75 mcg by mouth daily before breakfast.    nitroGLYCERIN (NITROSTAT) 0.4 MG SL tablet Place 1 tablet (0.4 mg total) under the tongue every 5 (five) minutes as needed for chest pain (Up to 3 doses. If taking 3rd dose call 911).   Omega-3 Fatty Acids (FISH OIL) 1000 MG CAPS Take 1,000 mg by mouth 2 (two) times daily.   [DISCONTINUED] carvedilol (COREG) 6.25 MG tablet Take 1 tablet (6.25 mg total) by mouth 2 (two) times daily with a  meal.   [DISCONTINUED] losartan (COZAAR) 25 MG tablet Take 0.5 tablets (12.5 mg total) by mouth daily.   [DISCONTINUED] rosuvastatin (CRESTOR) 40 MG tablet TAKE 1 TABLET(40 MG) BY MOUTH DAILY   [DISCONTINUED] spironolactone (ALDACTONE) 25 MG tablet Take 0.5 tablets (12.5 mg total) by mouth daily.     Allergies:   Nitrofurantoin monohyd macro, Atorvastatin, Simvastatin, Ciprofloxacin, and Penicillins   Social History   Socioeconomic History   Marital status: Single    Spouse name: Not on file   Number of children: Not on file   Years of education: Not on file   Highest education level: Not  on file  Occupational History   Not on file  Tobacco Use   Smoking status: Every Day    Types: Cigarettes   Smokeless tobacco: Never  Vaping Use   Vaping Use: Never used  Substance and Sexual Activity   Alcohol use: Not Currently    Comment: occ   Drug use: No   Sexual activity: Not on file  Other Topics Concern   Not on file  Social History Narrative   Not on file   Social Determinants of Health   Financial Resource Strain: Not on file  Food Insecurity: Not on file  Transportation Needs: Not on file  Physical Activity: Not on file  Stress: Not on file  Social Connections: Not on file     Family History: The patient's family history includes Heart attack in her brother and father; High blood pressure in her brother and mother; Hyperlipidemia in her brother.  ROS:   Please see the history of present illness.    She feels good.  No urinary symptoms on Farxiga.  All other systems reviewed and are negative.  EKGs/Labs/Other Studies Reviewed:    The following studies were reviewed today:  Cardiac catheterization 2020: Diagnostic Dominance: Right  Intervention     2D Doppler echocardiogram 05/06/2019: IMPRESSIONS   1. Left ventricular ejection fraction, by estimation, is 40 to 45%. The  left ventricle has mildly decreased function. The left ventricle  demonstrates regional wall motion abnormalities (see scoring  diagram/findings for description). Left ventricular  diastolic parameters are consistent with Grade I diastolic dysfunction  (impaired relaxation).   2. Right ventricular systolic function is normal. The right ventricular  size is normal.   3. The mitral valve is normal in structure and function. Mild mitral  valve regurgitation. No evidence of mitral stenosis.   4. The aortic valve is normal in structure and function. Aortic valve  regurgitation is not visualized. No aortic stenosis is present.   5. The inferior vena cava is normal in size with greater  than 50%  respiratory variability, suggesting right atrial pressure of 3 mmHg.   EKG:  EKG normal sinus rhythm, inferior lateral Q waves with T wave inversion.  EKG is stable when compared to prior tracing done in January 2023.  Recent Labs: No results found for requested labs within last 365 days.  Recent Lipid Panel    Component Value Date/Time   CHOL 133 04/23/2019 0850   TRIG 126 04/23/2019 0850   HDL 57 04/23/2019 0850   CHOLHDL 2.3 04/23/2019 0850   CHOLHDL 3.9 03/23/2018 0443   VLDL 6 03/23/2018 0443   LDLCALC 54 04/23/2019 0850    Physical Exam:    VS:  BP 138/84   Pulse 79   Ht 5' (1.524 m)   Wt 144 lb 3.2 oz (65.4 kg)   LMP 03/29/2016   SpO2 95%  BMI 28.16 kg/m     Wt Readings from Last 3 Encounters:  02/10/22 144 lb 3.2 oz (65.4 kg)  03/26/21 150 lb 6.4 oz (68.2 kg)  02/20/20 137 lb 3.2 oz (62.2 kg)     GEN: Weight is slightly improved compared to last year. No acute distress HEENT: Normal NECK: No JVD. LYMPHATICS: No lymphadenopathy CARDIAC: No murmur. RRR no gallop, or edema. VASCULAR:  Normal Pulses. No bruits. RESPIRATORY:  Clear to auscultation without rales, wheezing or rhonchi  ABDOMEN: Soft, non-tender, non-distended, No pulsatile mass, MUSCULOSKELETAL: No deformity  SKIN: Warm and dry NEUROLOGIC:  Alert and oriented x 3 PSYCHIATRIC:  Normal affect   ASSESSMENT:    1. S/P coronary artery stent placement   2. Chronic combined systolic and diastolic heart failure ([place])   3. Controlled type 2 diabetes mellitus with other circulatory complication, without long-term current use of insulin (HCC)   4. Other hyperlipidemia   5. Tobacco abuse    PLAN:    In order of problems listed above:  Secondary prevention reviewed.  Smoking cessation discussed. Continue losartan, carvedilol, spironolactone, and institute SGLT2 therapy. SGLT2 therapy.  Indicated for diabetes and heart failure/CV risk reduction. Continue high intensity statin  therapy. Encouraged her to stop smoking.  Guideline directed therapy for left ventricular systolic dysfunction: Angiotensin receptor-neprilysin inhibitor (ARNI)-Entresto; beta-blocker therapy - carvedilol, metoprolol succinate, or bisoprolol; mineralocorticoid receptor antagonist (MRA) therapy -spironolactone or eplerenone.  SGLT-2 agents -  Dapagliflozin [place]) or Empagliflozin (Jardiance).These therapies have been shown to improve clinical outcomes including reduction of rehospitalization, survival, and acute heart failure.   Overall education and awareness concerning primary/secondary risk prevention was discussed in detail: LDL less than 70, hemoglobin A1c less than 7, blood pressure target less than 130/80 mmHg, >150 minutes of moderate aerobic activity per week, avoidance of smoking, weight control (via diet and exercise), and continued surveillance/management of/for obstructive sleep apnea.  6 to 54-monthfollow-up with [facility]/Th[facility]   Medication Adjustments/Labs and Tests Ordered: Current medicines are reviewed at length with the patient today.  Concerns regarding medicines are outlined above.  Orders Placed This Encounter  Procedures   EKG 12-Lead   Meds ordered this encounter  Medications   carvedilol (COREG) 6.25 MG tablet    Sig: Take 1 tablet (6.25 mg total) by mouth 2 (two) times daily with a meal.    Dispense:  180 tablet    Refill:  3   losartan (COZAAR) 25 MG tablet    Sig: Take 0.5 tablets (12.5 mg total) by mouth daily.    Dispense:  45 tablet    Refill:  3   rosuvastatin (CRESTOR) 40 MG tablet    Sig: TAKE 1 TABLET(40 MG) BY MOUTH DAILY    Dispense:  90 tablet    Refill:  3   spironolactone (ALDACTONE) 25 MG tablet    Sig: Take 0.5 tablets (12.5 mg total) by mouth daily.    Dispense:  45 tablet    Refill:  3   dapagliflozin propanediol (FARXIGA) 10 MG TABS tablet    Sig: Take 1 tablet (10 mg total) by mouth daily before breakfast.    Dispense:  30  tablet    Refill:  11    Patient Instructions  Medication Instructions:  Your physician has recommended you make the following change in your medication:   1) START dapagliflozin (Farxiga) '10mg'$  daily with breakfast 2) STOP clopidogrel (Plavix) 3) START aspirin '81mg'$  daily  *If you need a refill on your cardiac medications before  your next appointment, please call your pharmacy*  Lab Work: In 2 weeks: BMET If you have labs (blood work) drawn today and your tests are completely normal, you will receive your results only by: [place] (if you have MyChart) OR A paper copy in the mail If you have any lab test that is abnormal or we need to change your treatment, we will call you to review the results.  Testing/Procedures: Your physician has requested that you have an echocardiogram. Echocardiography is a painless test that uses sound waves to create images of your heart. It provides your doctor with information about the size and shape of your heart and how well your heart's chambers and valves are working. This procedure takes approximately one hour. There are no restrictions for this procedure. Please do NOT wear cologne, perfume, aftershave, or lotions (deodorant is allowed). Please arrive 15 minutes prior to your appointment time.  Follow-Up: At [facility], you and your health needs are our priority.  As part of our continuing mission to provide you with exceptional heart care, we have created designated Provider Care Teams.  These Care Teams include your primary Cardiologist (physician) and Advanced Practice Providers (APPs -  Physician Assistants and Nurse Practitioners) who all work together to provide you with the care you need, when you need it.  Your next appointment:   6-9 month(s)  The format for your next appointment:   In Person  Provider:   Lenna Sciara, MD or Larae Grooms, MD or Rudean Haskell, MD  Important Information About  [place]         Signed, Sinclair Grooms, MD  02/10/2022 10:11 AM    [place]

## 2022-02-08 NOTE — Telephone Encounter (Signed)
Patient has an appointment with Dr Tamala Julian on 11/30 and would like to discuss starting Farxiga.

## 2022-02-08 NOTE — Telephone Encounter (Signed)
Pt states she has been trying to get [place], initally prescribed by Dr. Ernesto Rutherford (nephrology) and Renaldo Reel.   She states they told her they need it to be prescribed by cardiology and pt would like to further discuss at her upcoming appt on 02/10/22 with Dr. Tamala Julian.

## 2022-02-10 ENCOUNTER — Ambulatory Visit: Payer: BC Managed Care – PPO | Attending: Interventional Cardiology | Admitting: Interventional Cardiology

## 2022-02-10 ENCOUNTER — Other Ambulatory Visit: Payer: Self-pay

## 2022-02-10 ENCOUNTER — Encounter: Payer: Self-pay | Admitting: Interventional Cardiology

## 2022-02-10 VITALS — BP 138/84 | HR 79 | Ht 60.0 in | Wt 144.2 lb

## 2022-02-10 DIAGNOSIS — Z720 Tobacco use: Secondary | ICD-10-CM

## 2022-02-10 DIAGNOSIS — I5042 Chronic combined systolic (congestive) and diastolic (congestive) heart failure: Secondary | ICD-10-CM

## 2022-02-10 DIAGNOSIS — E1159 Type 2 diabetes mellitus with other circulatory complications: Secondary | ICD-10-CM | POA: Diagnosis not present

## 2022-02-10 DIAGNOSIS — E7849 Other hyperlipidemia: Secondary | ICD-10-CM | POA: Diagnosis not present

## 2022-02-10 DIAGNOSIS — Z955 Presence of coronary angioplasty implant and graft: Secondary | ICD-10-CM

## 2022-02-10 MED ORDER — ROSUVASTATIN CALCIUM 40 MG PO TABS: ORAL_TABLET | ORAL | 3 refills | Status: DC

## 2022-02-10 MED ORDER — DAPAGLIFLOZIN PROPANEDIOL 10 MG PO TABS: 10.0000 mg | ORAL_TABLET | Freq: Every day | ORAL | 11 refills | Status: DC

## 2022-02-10 MED ORDER — LOSARTAN POTASSIUM 25 MG PO TABS: 12.5000 mg | ORAL_TABLET | Freq: Every day | ORAL | 3 refills | Status: DC

## 2022-02-10 MED ORDER — ASPIRIN 81 MG PO TBEC: 81.0000 mg | DELAYED_RELEASE_TABLET | Freq: Every day | ORAL | Status: AC

## 2022-02-10 MED ORDER — CARVEDILOL 6.25 MG PO TABS: 6.2500 mg | ORAL_TABLET | Freq: Two times a day (BID) | ORAL | 3 refills | Status: DC

## 2022-02-10 MED ORDER — SPIRONOLACTONE 25 MG PO TABS: 12.5000 mg | ORAL_TABLET | Freq: Every day | ORAL | 3 refills | Status: DC

## 2022-02-10 NOTE — Patient Instructions (Signed)
Medication Instructions:  Your physician has recommended you make the following change in your medication:   1) START dapagliflozin Wilder Glade) '10mg'$  daily with breakfast 2) STOP clopidogrel (Plavix) 3) START aspirin '81mg'$  daily  *If you need a refill on your cardiac medications before your next appointment, please call your pharmacy*  Lab Work: In 2 weeks: BMET If you have labs (blood work) drawn today and your tests are completely normal, you will receive your results only by: [place] (if you have MyChart) OR A paper copy in the mail If you have any lab test that is abnormal or we need to change your treatment, we will call you to review the results.  Testing/Procedures: Your physician has requested that you have an echocardiogram. Echocardiography is a painless test that uses sound waves to create images of your heart. It provides your doctor with information about the size and shape of your heart and how well your heart's chambers and valves are working. This procedure takes approximately one hour. There are no restrictions for this procedure. Please do NOT wear cologne, perfume, aftershave, or lotions (deodorant is allowed). Please arrive 15 minutes prior to your appointment time.  Follow-Up: At [facility], you and your health needs are our priority.  As part of our continuing mission to provide you with exceptional heart care, we have created designated Provider Care Teams.  These Care Teams include your primary Cardiologist (physician) and Advanced Practice Providers (APPs -  Physician Assistants and Nurse Practitioners) who all work together to provide you with the care you need, when you need it.  Your next appointment:   6-9 month(s)  The format for your next appointment:   In Person  Provider:   Lenna Sciara, MD or Larae Grooms, MD or Rudean Haskell, MD  Important Information About Sugar

## 2022-02-10 NOTE — Telephone Encounter (Signed)
Discussed at office visit with Dr. Tamala Julian today.

## 2022-03-04 ENCOUNTER — Ambulatory Visit (HOSPITAL_COMMUNITY): Payer: BC Managed Care – PPO

## 2022-03-05 ENCOUNTER — Emergency Department (HOSPITAL_BASED_OUTPATIENT_CLINIC_OR_DEPARTMENT_OTHER)
Admission: EM | Admit: 2022-03-05 | Discharge: 2022-03-05 | Disposition: A | Discharge status: Home / Self Care | Payer: BC Managed Care – PPO | Attending: Emergency Medicine | Admitting: Emergency Medicine

## 2022-03-05 ENCOUNTER — Other Ambulatory Visit: Payer: Self-pay

## 2022-03-05 ENCOUNTER — Encounter (HOSPITAL_BASED_OUTPATIENT_CLINIC_OR_DEPARTMENT_OTHER): Payer: Self-pay | Admitting: Emergency Medicine

## 2022-03-05 DIAGNOSIS — J069 Acute upper respiratory infection, unspecified: Secondary | ICD-10-CM | POA: Diagnosis not present

## 2022-03-05 DIAGNOSIS — Z1152 Encounter for screening for COVID-19: Secondary | ICD-10-CM | POA: Diagnosis not present

## 2022-03-05 DIAGNOSIS — R051 Acute cough: Secondary | ICD-10-CM | POA: Insufficient documentation

## 2022-03-05 DIAGNOSIS — B338 Other specified viral diseases: Secondary | ICD-10-CM | POA: Insufficient documentation

## 2022-03-05 DIAGNOSIS — R0981 Nasal congestion: Secondary | ICD-10-CM | POA: Diagnosis present

## 2022-03-05 LAB — RESP PANEL BY RT-PCR (RSV, FLU A&B, COVID)  RVPGX2
Influenza A by PCR: NEGATIVE
Influenza B by PCR: NEGATIVE
Resp Syncytial Virus by PCR: POSITIVE — AB
SARS Coronavirus 2 by RT PCR: NEGATIVE

## 2022-03-05 MED ORDER — BENZONATATE 100 MG PO CAPS: 100.0000 mg | ORAL_CAPSULE | Freq: Three times a day (TID) | ORAL | 0 refills | Status: DC | PRN

## 2022-03-05 NOTE — ED Triage Notes (Signed)
Patient c/o nasal congestion, has been treated for sinus infection, currently on doxycycline with no improvement. Patient states 2 grandkids have RSV and flu at this time.

## 2022-03-05 NOTE — Discharge Instructions (Signed)
Your history and exam today led [place] to get a viral testing which revealed you have RSV causing your symptoms.  This is likely why the doxycycline did not seem to help for suspected sinus infection.  Your lungs were clear and you are not hypoxic thus we agreed to go to hold on x-ray at this time.  I will suspicion for pneumonia.  Your exam was otherwise reassuring.  We do feel you are appropriate for cough medicine and close follow-up.  Please rest and stay hydrated.  If you develop fevers please treat with Motrin or Tylenol.  Please rest and stay hydrated.  If any symptoms change or worsen, please return to the nearest emergency room.  Please follow-up with your primary doctor.

## 2022-03-05 NOTE — ED Provider Notes (Signed)
[place] EMERGENCY DEPARTMENT Provider Note   CSN: [id number] Arrival date & time: 03/05/22  1653     History  Chief Complaint  Patient presents with   Nasal Congestion    Brooke Williamson is a 56 y.o. female.  The history is provided by the patient and medical records. No language interpreter was used.  URI Presenting symptoms: congestion, cough and rhinorrhea   Presenting symptoms: no fatigue, no fever and no sore throat   Severity:  Moderate Onset quality:  Gradual Duration:  3 days Timing:  Constant Progression:  Waxing and waning Chronicity:  New Worsened by:  Nothing Ineffective treatments:  None tried Associated symptoms: sneezing   Associated symptoms: no headaches, no myalgias, no sinus pain and no wheezing   Risk factors: sick contacts        Home Medications Prior to Admission medications   Medication Sig Start Date End Date Taking? Authorizing Provider  aspirin EC 81 MG tablet Take 1 tablet (81 mg total) by mouth daily. Swallow whole. 02/10/22   Belva Crome, MD  carvedilol (COREG) 6.25 MG tablet Take 1 tablet (6.25 mg total) by mouth 2 (two) times daily with a meal. 02/10/22   Belva Crome, MD  cetirizine (ZYRTEC) 10 MG tablet Take 10 mg by mouth as needed. 03/05/21   [provider]  dapagliflozin propanediol (FARXIGA) 10 MG TABS tablet Take 1 tablet (10 mg total) by mouth daily before breakfast. 02/10/22   Belva Crome, MD  fluticasone [facility]) 50 MCG/ACT nasal spray Place 1 spray into both nostrils as needed for rhinitis or allergies. 03/05/21   [provider]  levothyroxine (SYNTHROID) 75 MCG tablet Take 75 mcg by mouth daily before breakfast.  11/22/17   [provider]  losartan (COZAAR) 25 MG tablet Take 0.5 tablets (12.5 mg total) by mouth daily. 02/10/22   Belva Crome, MD  nitroGLYCERIN (NITROSTAT) 0.4 MG SL tablet Place 1 tablet (0.4 mg total) under the tongue every 5 (five) minutes as needed  for chest pain (Up to 3 doses. If taking 3rd dose call 911). 02/20/20 02/10/22  Belva Crome, MD  Omega-3 Fatty Acids (FISH OIL) 1000 MG CAPS Take 1,000 mg by mouth 2 (two) times daily.    [provider]  rosuvastatin (CRESTOR) 40 MG tablet TAKE 1 TABLET(40 MG) BY MOUTH DAILY 02/10/22   Belva Crome, MD  spironolactone (ALDACTONE) 25 MG tablet Take 0.5 tablets (12.5 mg total) by mouth daily. 02/10/22   Belva Crome, MD      Allergies    Nitrofurantoin monohyd macro, Atorvastatin, Simvastatin, Ciprofloxacin, and Penicillins    Review of Systems   Review of Systems  Constitutional:  Negative for chills, fatigue and fever.  HENT:  Positive for congestion, rhinorrhea and sneezing. Negative for sinus pressure, sinus pain, sore throat and trouble swallowing.   Respiratory:  Positive for cough. Negative for chest tightness, shortness of breath and wheezing.   Cardiovascular:  Negative for chest pain, palpitations and leg swelling.  Gastrointestinal:  Negative for abdominal pain, constipation, diarrhea, nausea and vomiting.  Genitourinary:  Negative for dysuria.  Musculoskeletal:  Negative for back pain and myalgias.  Skin:  Negative for rash and wound.  Neurological:  Negative for weakness, light-headedness, numbness and headaches.  Psychiatric/Behavioral:  Negative for agitation.   All other systems reviewed and are negative.   Physical Exam Updated Vital Signs BP 116/77   Pulse (!) 103   Temp 98.4 F (36.9 C) (  Oral)   Resp 17   Ht 5' (1.524 m)   Wt 68 kg   LMP 03/29/2016   SpO2 100%   BMI 29.29 kg/m  Physical Exam Vitals and nursing note reviewed.  Constitutional:      General: She is not in acute distress.    Appearance: She is well-developed. She is not ill-appearing, toxic-appearing or diaphoretic.  HENT:     Head: Normocephalic and atraumatic.     Nose: Congestion present.     Mouth/Throat:     Mouth: Mucous membranes are moist.  Eyes:     Extraocular  Movements: Extraocular movements intact.     Conjunctiva/sclera: Conjunctivae normal.     Pupils: Pupils are equal, round, and reactive to light.  Cardiovascular:     Rate and Rhythm: Normal rate and regular rhythm.     Pulses: Normal pulses.     Heart sounds: No murmur heard. Pulmonary:     Effort: Pulmonary effort is normal. No respiratory distress.     Breath sounds: Normal breath sounds. No wheezing, rhonchi or rales.  Chest:     Chest wall: No tenderness.  Abdominal:     General: Abdomen is flat.     Palpations: Abdomen is soft.     Tenderness: There is no abdominal tenderness. There is no right CVA tenderness, left CVA tenderness, guarding or rebound.  Musculoskeletal:        General: No swelling or tenderness.     Cervical back: Neck supple. No tenderness.     Right lower leg: No edema.     Left lower leg: No edema.  Skin:    General: Skin is warm and dry.     Capillary Refill: Capillary refill takes less than 2 seconds.     Findings: No erythema.  Neurological:     General: No focal deficit present.     Mental Status: She is alert.  Psychiatric:        Mood and Affect: Mood normal.     ED Results / Procedures / Treatments   Labs (all labs ordered are listed, but only abnormal results are displayed) Labs Reviewed  RESP PANEL BY RT-PCR (RSV, FLU A&B, COVID)  RVPGX2 - Abnormal; Notable for the following components:      Result Value   Resp Syncytial Virus by PCR POSITIVE (*)    All other components within normal limits    EKG None  Radiology No results found.  Procedures Procedures    Medications Ordered in ED Medications - No data to display  ED Course/ Medical Decision Making/ A&P                           Medical Decision Making   Chandell Attridge is a 56 y.o. female with a past medical history significant for CAD, diabetes, hypertension, and GERD who presents with URI symptoms and cough.  According to patient, for the last 3 days she has had  congestion, cough, and URI symptoms.  She reports 2 grandkids have had flu and RSV.  She was started on doxycycline by a provider who thought she might have a sinus infection given the congestion and drainage however she says it is not helping.  She denies any chest pain or shortness of breath and denies nausea, vomiting, constipation, diarrhea, or urinary changes.  No GI symptoms.  No rashes.  She denies fevers or chills at this time.  On exam, lungs were clear and  there was no wheezing rhonchi or rales.  Chest and back were nontender.  Abdomen nontender.  She does have a drainage of the congestion but no stridor.  Vital signs reassuring on my evaluation without tachycardia, fever, tachypnea, or hypoxia.  Blood pressure was not low or elevated.  Patient resting but is continued to cough.  Exam otherwise unremarkable.  No leg tenderness or leg swelling seen.  Patient had workup which did confirm she does have RSV.  COVID and flu negative.  Given the reassuring lung exam we agreed to hold on chest x-ray.  We discussed that she can decide if she wants to stop the doxycycline or not as we are clinically not concerned about pneumonia nor sinus infection and instead think this is all RSV.  We will give prescription for Tessalon and she will get a work note.  She will follow-up with PCP.  She agrees with plan of care and will maintain hydration and treat any fever she develops.  She no questions or concerns and was discharged in good condition.         Final Clinical Impression(s) / ED Diagnoses Final diagnoses:  RSV infection  Upper respiratory tract infection, unspecified type  Acute cough    Clinical Impression: 1. RSV infection   2. Upper respiratory tract infection, unspecified type   3. Acute cough     Disposition: Discharge  Condition: Good  I have discussed the results, Dx and Tx plan with the pt(& family if present). He/she/they expressed understanding and agree(s) with the plan.  Discharge instructions discussed at great length. Strict return precautions discussed and pt &/or family have verbalized understanding of the instructions. No further questions at time of discharge.    New Prescriptions   BENZONATATE (TESSALON) 100 MG CAPSULE    Take 1 capsule (100 mg total) by mouth 3 (three) times daily as needed for cough.    Follow Up: [address], [place] [id number]     [facility] [place] EMERGENCY DEPARTMENT [address] [id number] [address] [place]        Delorise Hunkele, Gwenyth Allegra, MD 03/05/22 Casimer Lanius

## 2022-03-21 ENCOUNTER — Other Ambulatory Visit: Payer: Self-pay

## 2022-03-21 MED ORDER — LOSARTAN POTASSIUM 25 MG PO TABS: 12.5000 mg | ORAL_TABLET | Freq: Every day | ORAL | 3 refills | Status: DC

## 2022-03-24 ENCOUNTER — Other Ambulatory Visit (HOSPITAL_COMMUNITY): Payer: BC Managed Care – PPO

## 2022-03-29 ENCOUNTER — Telehealth: Payer: Self-pay | Admitting: Interventional Cardiology

## 2022-03-29 MED ORDER — METOPROLOL SUCCINATE ER 50 MG PO TB24: 50.0000 mg | ORAL_TABLET | Freq: Every day | ORAL | 3 refills | Status: DC

## 2022-03-29 NOTE — Telephone Encounter (Signed)
Called pt in regards to HA after taking 2nd dose of coreg daily.   Pt expresses HA started after starting Farxiga. HA comes on about 30 min after taking evening dose of Coreg.  Has been on Coreg for years and never had HA.  Wants to know if can take both doses of Coreg in the morning.  Advised pt med has to be taken Q12 hours.  BP runs around 128/82.  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  Last Echo 05/06/19 EF 40-45%. Should pt trial off of Farxiga to see if HA stop or adjust Coreg? Will send to pharmacy for recommendations.

## 2022-03-29 NOTE — Telephone Encounter (Signed)
Pt c/o medication issue:  1. Name of Medication:   carvedilol (COREG) 6.25 MG tablet   2. How are you currently taking this medication (dosage and times per day)?   As prescribed  3. Are you having a reaction (difficulty breathing--STAT)?   No  4. What is your medication issue?   Patient states when she takes the second dose of this medication it gives her a really bad headache on the left side of her head.

## 2022-03-29 NOTE — Telephone Encounter (Signed)
Brooke Williamson is not known to cause headache, would prefer her to continue this given her DM and CHF. Carvedilol can cause headaches, although not sure if it's the cause if she's been on the med for years without issue. Not sure her headache is med related.   If she wishes, could try changing her carvedilol to equivalent dose of metoprolol succinate '50mg'$  daily. This doesn't control BP as well though, she should keep an eye on her BP and HR, may need future dose increase of either her metoprolol, losartan, or spironolactone. CMET stable in Care Everywhere from Nov 2023.

## 2022-03-29 NOTE — Telephone Encounter (Signed)
Called pt advised of pharmacist recommendation:   Wilder Glade is not known to cause headache, would prefer her to continue this given her DM and CHF. Carvedilol can cause headaches, although not sure if it's the cause if she's been on the med for years without issue. Not sure her headache is med related.    If she wishes, could try changing her carvedilol to equivalent dose of metoprolol succinate '50mg'$  daily. This doesn't control BP as well though, she should keep an eye on her BP and HR, may need future dose increase of either her metoprolol, losartan, or spironolactone. CMET stable in Care Everywhere from Nov 2023.  Pt is agreeable to stop carvedilol and start metoprolol succinate 50 mg PO QD.  Advised pt to monitor BP and HR once a day for the first 2 weeks to ensure BP is adequately controlled.  Pt verbalizes understanding.  Advised to call back with questions/ concerns.

## 2022-04-05 ENCOUNTER — Ambulatory Visit (HOSPITAL_COMMUNITY): Payer: BC Managed Care – PPO | Attending: Internal Medicine

## 2022-04-05 ENCOUNTER — Ambulatory Visit: Payer: BC Managed Care – PPO

## 2022-04-05 DIAGNOSIS — I5042 Chronic combined systolic (congestive) and diastolic (congestive) heart failure: Secondary | ICD-10-CM

## 2022-04-06 LAB — BASIC METABOLIC PANEL
BUN/Creatinine Ratio: 13 (ref 9–23)
BUN: 12 mg/dL (ref 6–24)
CO2: 25 mmol/L (ref 20–29)
Calcium: 9.7 mg/dL (ref 8.7–10.2)
Chloride: 101 mmol/L (ref 96–106)
Creatinine, Ser: 0.92 mg/dL (ref 0.57–1.00)
Glucose: 157 mg/dL — ABNORMAL HIGH (ref 70–99)
Potassium: 3.9 mmol/L (ref 3.5–5.2)
Sodium: 139 mmol/L (ref 134–144)
eGFR: 73 mL/min/{1.73_m2} (ref >59)

## 2022-04-06 LAB — ECHOCARDIOGRAM COMPLETE
Area-P 1/2: 3.31 cm2
S' Lateral: 3.1 cm

## 2022-05-25 ENCOUNTER — Telehealth: Payer: Self-pay | Admitting: Internal Medicine

## 2022-05-25 NOTE — Telephone Encounter (Signed)
Patient is calling and have a question regarding a medication metoprolol succinate (TOPROL-XL) 50 MG 24 hr tablet. Please call back

## 2022-05-25 NOTE — Telephone Encounter (Signed)
Patient called and wanted to know what medication she was taken before taken metoprolol succinate. I informed her that the metoprolol succinate replaced carvedilol because she was having headaches after her second dose.  Patient has order for Farxiga '10mg'$ . She states she cuts the pill in half per her nephrologist. She would like to know if she can get a new Rx for Farxiga '5mg'$ so she does not have to cut the pill in half. She stated the pharmacy informed her that they are now making '5mg'$  tablets of farxiga. She stated the order has to come from [place]. Dr. Tamala Julian is who got her approved for farxiga for her nephrologist.

## 2022-05-25 NOTE — Telephone Encounter (Signed)
Is it okay to order Farxiga 5 mg?

## 2022-05-26 NOTE — Telephone Encounter (Signed)
Called pt advised of MD response: IT is fine for one refill.  If Nephrology is making this recommendation, they are able to write for the medication (as there is a CKD indication).  So Nephrology should write for this medication in the future if this is their recommendation.  If she is nearing running out, I can refill X1.        Pt will call nephrology to notify that MD is okay with taking Farxiga 5 mg PO QD.  No further needs from our office at this time.

## 2022-06-13 ENCOUNTER — Other Ambulatory Visit: Payer: Self-pay

## 2022-06-13 MED ORDER — SPIRONOLACTONE 25 MG PO TABS: 12.5000 mg | ORAL_TABLET | Freq: Every day | ORAL | 0 refills | Status: DC

## 2022-08-02 ENCOUNTER — Telehealth: Payer: Self-pay | Admitting: Interventional Cardiology

## 2022-08-02 NOTE — Telephone Encounter (Signed)
Pt c/o medication issue:  1. Name of Medication: Metoprolol  2. How are you currently taking this medication (dosage and times per day)?  1 time a day.  3. Are you having a reaction (difficulty breathing--STAT)?   4. What is your medication issue? This medicine is causing her to have headaches

## 2022-08-02 NOTE — Telephone Encounter (Signed)
Called pt in regards to metoprolol.  Reports noted a HA about a week ago.  Reports doesn't have HA often.  Did not take metoprolol this morning and does not have a HA.  Reports BP runs 132/84-137/82.  Pt also reports PCP stopped Farxiga d/t HA about 2 weeks ago.  Has an upcoming OV with PCP to discuss an alternative to [place].  Removed med from medication list.  Pt would like to know what to take in place of metoprolol.

## 2022-08-03 NOTE — Telephone Encounter (Signed)
Not sure headache is related to her beta blocker. Looks like she called in January with a similar concern. Was taking carvedilol at that time which was changed to metoprolol instead. If her headache has not improved after stopping [place] or with change in beta blocker therapy, may not be due to her meds. She ideally needs SGLT2i given her CHF. If she doesn't want to restart Marcelline Deist, could discuss Jardiance with her PCP when she sees them. If she wants to change her beta blocker again, only other one approved for CHF is bisoprolol, would start at 2.5mg  daily if she wants to try this.

## 2022-08-05 NOTE — Telephone Encounter (Signed)
Left a message to call back.

## 2022-08-05 NOTE — Telephone Encounter (Signed)
Called pt in regards to HA possibly r/t beta blocker.  Pt now reports restarted Carvedilol thinks 6.25 mg PO BID on Monday or Tuesday.  Is no longer having HA.  BP 134/80-84. Advised pt will send to MD to review and approve or deny med change to carvedilol.

## 2022-08-09 NOTE — Telephone Encounter (Signed)
Patient returned RN's call. 

## 2022-08-10 NOTE — Telephone Encounter (Signed)
Left a message to call back.

## 2022-08-12 ENCOUNTER — Encounter: Payer: Self-pay | Admitting: Internal Medicine

## 2022-08-12 ENCOUNTER — Ambulatory Visit: Payer: BC Managed Care – PPO | Attending: Internal Medicine | Admitting: Internal Medicine

## 2022-08-12 VITALS — BP 126/78 | HR 63 | Ht 60.0 in | Wt 137.6 lb

## 2022-08-12 DIAGNOSIS — I255 Ischemic cardiomyopathy: Secondary | ICD-10-CM

## 2022-08-12 DIAGNOSIS — Z955 Presence of coronary angioplasty implant and graft: Secondary | ICD-10-CM | POA: Insufficient documentation

## 2022-08-12 DIAGNOSIS — I5020 Unspecified systolic (congestive) heart failure: Secondary | ICD-10-CM

## 2022-08-12 DIAGNOSIS — Z720 Tobacco use: Secondary | ICD-10-CM

## 2022-08-12 DIAGNOSIS — I10 Essential (primary) hypertension: Secondary | ICD-10-CM

## 2022-08-12 DIAGNOSIS — I4729 Other ventricular tachycardia: Secondary | ICD-10-CM

## 2022-08-12 DIAGNOSIS — I2510 Atherosclerotic heart disease of native coronary artery without angina pectoris: Secondary | ICD-10-CM

## 2022-08-12 NOTE — Patient Instructions (Signed)
Medication Instructions:  Your physician recommends that you continue on your current medications as directed. Please refer to the Current Medication list given to you today.  *If you need a refill on your cardiac medications before your next appointment, please call your pharmacy*   Lab Work: FLP, BMP  If you have labs (blood work) drawn today and your tests are completely normal, you will receive your results only by: MyChart Message (if you have MyChart) OR A paper copy in the mail If you have any lab test that is abnormal or we need to change your treatment, we will call you to review the results.   Testing/Procedures: NONE   Follow-Up: At [facility], you and your health needs are our priority.  As part of our continuing mission to provide you with exceptional heart care, we have created designated Provider Care Teams.  These Care Teams include your primary Cardiologist (physician) and Advanced Practice Providers (APPs -  Physician Assistants and Nurse Practitioners) who all work together to provide you with the care you need, when you need it.    Your next appointment:   1 year(s)  Provider:   Riley Lam, MD or Jari Favre, PA-C or Robin Searing, NP

## 2022-08-12 NOTE — Progress Notes (Signed)
Cardiology Office Note:    Date:  08/12/2022   ID:  Keauna, Bolam 1965/05/10, MRN [id number]  PCP:  Loura Pardon, PA  Cardiologist:  Lesleigh Noe, MD (Inactive)   Referring MD: Loura Pardon*   CC: Transition to new cardiologist  History of Present Illness:    Brooke Williamson is a 57 y.o. female with a hx of HTN, HLD, DM II, systolic and diastolic heart failure (LVEF 40 to 45% 2021), tobacco abuse, GERD, PUD, & thyroid disease and remote lateral STEMI treated with DES 03/2018  2023: Working to stop smoking. Saw Dr. Katrinka Blazing. 2024: well  Patient notes that she is doing great.   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 No chest pain or pressure .  No SOB/DOE and no PND/Orthopnea.  No weight gain or leg swelling.  No palpitations or syncope . Stopped Farxiga due to headache.  HA resolved. PCP put of fenofibrate no statin with no change in headache    Past Medical History:  Diagnosis Date   CAD (coronary artery disease)    a. acute cath showed occlusion of large branching first obtuse marginal. She underwent Successful PCI and stent implantation in the proximal to mid first obtuse marginal. Distal embolization of thrombus was noted into the inferior most branch of the first obtuse marginal. LM, LAD, and RCA were normal. LVEF was 35-45% by cath 40-45% by echo.   Diabetes mellitus type 2 in nonobese [facility])    GERD (gastroesophageal reflux disease)    Hypercholesteremia    Hypertension    Ischemic cardiomyopathy    NSVT (nonsustained ventricular tachycardia) (HCC)    ST elevation myocardial infarction (STEMI) of inferior wall (HCC) 03/2018   Thyroid disease    Tobacco abuse    Ulcer     Past Surgical History:  Procedure Laterality Date   CORONARY/GRAFT ACUTE MI REVASCULARIZATION N/A 03/23/2018   Procedure: Coronary/Graft Acute MI Revascularization;  Surgeon: Lyn Records, MD;   Location: [facility] INVASIVE CV LAB;  Service: Cardiovascular;  Laterality: N/A;   LEFT HEART CATH AND CORONARY ANGIOGRAPHY N/A 03/23/2018   Procedure: LEFT HEART CATH AND CORONARY ANGIOGRAPHY;  Surgeon: Lyn Records, MD;  Location: [facility] INVASIVE CV LAB;  Service: Cardiovascular;  Laterality: N/A;   THYROID SURGERY      Current Medications: Current Meds  Medication Sig   aspirin EC 81 MG tablet Take 1 tablet (81 mg total) by mouth daily. Swallow whole.   carvedilol (COREG) 6.25 MG tablet Take 6.25 mg by mouth 2 (two) times daily.   cetirizine (ZYRTEC) 10 MG tablet Take 10 mg by mouth as needed for allergies.   fenofibrate (TRICOR) 48 MG tablet daily.   fluticasone (FLONASE) 50 MCG/ACT nasal spray Place 1 spray into both nostrils as needed for rhinitis or allergies.   levothyroxine (SYNTHROID) 75 MCG tablet Take 75 mcg by mouth daily before breakfast.    losartan (COZAAR) 25 MG tablet Take 0.5 tablets (12.5 mg total) by mouth daily.   nitroGLYCERIN (NITROSTAT) 0.4 MG SL tablet Place 1 tablet (0.4 mg total) under the tongue every 5 (five) minutes as needed for chest pain (Up to 3 doses. If taking 3rd dose call 911).   Omega-3 Fatty Acids (FISH OIL) 1000 MG CAPS Take 1,000 mg by mouth 2 (two) times daily.   spironolactone (ALDACTONE) 25 MG tablet Take 0.5 tablets (12.5 mg total) by mouth daily.   [DISCONTINUED] benzonatate (  TESSALON) 100 MG capsule Take 1 capsule (100 mg total) by mouth 3 (three) times daily as needed for cough.     Allergies:   Nitrofurantoin monohyd macro, Atorvastatin, Dapagliflozin, Simvastatin, Ciprofloxacin, Metoprolol, and Penicillins   Social History   Socioeconomic History   Marital status: Single    Spouse name: Not on file   Number of children: Not on file   Years of education: Not on file   Highest education level: Not on file  Occupational History   Not on file  Tobacco Use   Smoking status: Every Day    Types: Cigarettes   Smokeless tobacco: Never  Vaping Use    Vaping Use: Never used  Substance and Sexual Activity   Alcohol use: Not Currently    Comment: occ   Drug use: No   Sexual activity: Not on file  Other Topics Concern   Not on file  Social History Narrative   Not on file   Social Determinants of Health   Financial Resource Strain: Not on file  Food Insecurity: Not on file  Transportation Needs: Not on file  Physical Activity: Not on file  Stress: Not on file  Social Connections: Not on file     Family History: The patient's family history includes Heart attack in her brother and father; High blood pressure in her brother and mother; Hyperlipidemia in her brother.  EKGs/Labs/Other Studies Reviewed:    The following studies were reviewed today:  Cardiac Studies & Procedures   CARDIAC CATHETERIZATION  CARDIAC CATHETERIZATION 03/23/2018  Narrative  Acute inferolateral ST elevation myocardial infarction due to occlusion of a large branching first obtuse marginal.  Successful PCI and stent implantation in the proximal to mid first obtuse marginal reducing a 100% stenosis with TIMI grade 0 flow to TIMI grade III and 0% stenosis.  The lesion substrate is a [place] 1, 0, 0 bifurcation with a single stent crossing over the high lateral side branch.  Final balloon diameter 3 mm.  Distal embolization of thrombus is noted into the inferior most branch of the first obtuse marginal.  The circumflex proximal to and beyond the first obtuse marginal is normal.  Left main is normal  Left anterior descending wraps around the left ventricular apex and is normal.  Right coronary is dominant and normal.  Left ventricular function is reduced and estimated to be 35 to 45%.  LVEDP 17 mmHg.  Inferior and distal anterior wall hypokinesis is noted.  RECOMMENDATIONS:   Aspirin and Brilinta x12 months.  Brilinta could eventually be transitioned to Plavix for cost purposes.  Smoking cessation  Aggressive risk factor modification also to should  include blood pressure control and LDL lowering to less than 50.  Consider cardiac rehab  Discharge based upon clinical progress over the next 36 to 48 hours.  Findings Coronary Findings Diagnostic  Dominance: Right  Left Circumflex  First Obtuse Marginal Branch Vessel is large in size. Ost 1st Mrg lesion is 100% stenosed.  Intervention  Ost 1st Mrg lesion Stent A stent was successfully placed. Post-Intervention Lesion Assessment The intervention was successful. There is a 0% residual stenosis post intervention.     ECHOCARDIOGRAM  ECHOCARDIOGRAM COMPLETE 04/06/2022  Narrative ECHOCARDIOGRAM REPORT    Patient Name:   Teren Kandler Date of Exam: 04/05/2022 Medical Rec #:  [id number]           Height:       60.0 in Accession #:    [id number]  Weight:       150.0 lb Date of Birth:  1965/05/06            BSA:          1.652 m Patient Age:    56 years            BP:           138/84 mmHg Patient Gender: F                   HR:           71 bpm. Exam Location:  [address]  Procedure: 2D Echo, 3D Echo, Cardiac Doppler and Color Doppler  Indications:    I50.42 CHF  History:        Patient has prior history of Echocardiogram examinations, most recent 05/06/2019. CHF, CAD and Previous Myocardial Infarction, Arrythmias:NSVT; Risk Factors:Family History of Coronary Artery Disease, Hypertension, Dyslipidemia, Diabetes and Current Smoker. Ischemic Cardiomyopathy (prior EF 40-45%).  Sonographer:    Farrel Conners RDCS Referring Phys: Barry Dienes [facility]  IMPRESSIONS   1. Left ventricular ejection fraction, by estimation, is 45 to 50%. The left ventricle has mildly decreased function. The left ventricle demonstrates regional wall motion abnormalities (see scoring diagram/findings for description). Left ventricular diastolic parameters are consistent with Grade I diastolic dysfunction (impaired relaxation). 2. Right ventricular systolic function is normal. The  right ventricular size is normal. Tricuspid regurgitation signal is inadequate for assessing PA pressure. 3. The mitral valve is normal in structure. Mild to moderate mitral valve regurgitation, splay artifact in parasternal short axis suggests this may be underestimated. No evidence of mitral stenosis. 4. The aortic valve is tricuspid. Aortic valve regurgitation is not visualized. No aortic stenosis is present. 5. The inferior vena cava is normal in size with greater than 50% respiratory variability, suggesting right atrial pressure of 3 mmHg.  FINDINGS Left Ventricle: Left ventricular ejection fraction, by estimation, is 45 to 50%. The left ventricle has mildly decreased function. The left ventricle demonstrates regional wall motion abnormalities. 3D left ventricular ejection fraction analysis performed but not reported based on interpreter judgement due to suboptimal tracking. The left ventricular internal cavity size was normal in size. There is no left ventricular hypertrophy. Left ventricular diastolic parameters are consistent with Grade I diastolic dysfunction (impaired relaxation).   LV Wall Scoring: The mid and distal inferior wall is akinetic. The mid and distal anterior wall, antero-lateral wall, and mid and distal lateral wall are hypokinetic.  Right Ventricle: The right ventricular size is normal. No increase in right ventricular wall thickness. Right ventricular systolic function is normal. Tricuspid regurgitation signal is inadequate for assessing PA pressure.  Left Atrium: Left atrial size was normal in size.  Right Atrium: Right atrial size was normal in size.  Pericardium: There is no evidence of pericardial effusion.  Mitral Valve: The mitral valve is normal in structure. Mild to moderate mitral valve regurgitation. No evidence of mitral valve stenosis.  Tricuspid Valve: The tricuspid valve is normal in structure. Tricuspid valve regurgitation is trivial. No evidence of  tricuspid stenosis.  Aortic Valve: The aortic valve is tricuspid. Aortic valve regurgitation is not visualized. No aortic stenosis is present.  Pulmonic Valve: The pulmonic valve was normal in structure. Pulmonic valve regurgitation is trivial. No evidence of pulmonic stenosis.  Aorta: The aortic root is normal in size and structure.  Venous: The inferior vena cava is normal in size with greater than 50% respiratory variability, suggesting right atrial pressure  of 3 mmHg.  IAS/Shunts: No atrial level shunt detected by color flow Doppler.   LEFT VENTRICLE PLAX 2D LVIDd:         4.50 cm   Diastology LVIDs:         3.10 cm   LV e' medial:    6.53 cm/s LV PW:         0.60 cm   LV E/e' medial:  10.1 LV IVS:        0.80 cm   LV e' lateral:   11.10 cm/s LVOT diam:     2.00 cm   LV E/e' lateral: 5.9 LV SV:         55 LV SV Index:   33 LVOT Area:     3.14 cm  3D Volume EF: LV EDV:       144 ml LV ESV:       49 ml LV SV:        95 ml  RIGHT VENTRICLE RV Basal diam:  3.30 cm RV S prime:     10.60 cm/s TAPSE (M-mode): 1.3 cm  LEFT ATRIUM             Index        RIGHT ATRIUM           Index LA diam:        3.40 cm 2.06 cm/m   RA Area:     12.50 cm LA Vol (A2C):   56.2 ml 34.02 ml/m  RA Volume:   29.50 ml  17.86 ml/m LA Vol (A4C):   46.9 ml 28.39 ml/m LA Biplane Vol: 52.5 ml 31.78 ml/m AORTIC VALVE LVOT Vmax:   81.00 cm/s LVOT Vmean:  54.750 cm/s LVOT VTI:    0.176 m  AORTA Ao Root diam: 2.80 cm Ao Asc diam:  2.90 cm  MITRAL VALVE MV Area (PHT): cm         SHUNTS MV Decel Time: 229 msec    Systemic VTI:  0.18 m MV E velocity: 66.03 cm/s  Systemic Diam: 2.00 cm MV A velocity: 89.63 cm/s MV E/A ratio:  0.74  Weston Brass MD Electronically signed by Weston Brass MD Signature Date/Time: 04/06/2022/11:40:26 AM    Final    MONITORS  LONG TERM MONITOR (3-14 DAYS) 04/27/2020  Narrative  Basic rhythm is NSR with Ave HR 83 bpm (range 55-146 bpm)  Brief SVT  lating 4 beats at 146 bpm  Rare PAC's, and PVC's with burden < 1%  No atrial fibrillation  Poor corelation between triggered events and arrhythmia. On 1 occasion an isolated PVC was noted.  Overall, benign study with no action items.           Recent Labs: 04/05/2022: BUN 12; Creatinine, Ser 0.92; Potassium 3.9; Sodium 139  Recent Lipid Panel    Component Value Date/Time   CHOL 133 04/23/2019 0850   TRIG 126 04/23/2019 0850   HDL 57 04/23/2019 0850   CHOLHDL 2.3 04/23/2019 0850   CHOLHDL 3.9 03/23/2018 0443   VLDL 6 03/23/2018 0443   LDLCALC 54 04/23/2019 0850    Physical Exam:    VS:  BP 126/78   Pulse 63   Ht 5' (1.524 m)   Wt 137 lb 9.6 oz (62.4 kg)   LMP 03/29/2016   SpO2 99%   BMI 26.87 kg/m     Wt Readings from Last 3 Encounters:  08/12/22 137 lb 9.6 oz (62.4 kg)  03/05/22 150 lb (68 kg)  02/10/22 144 lb 3.2 oz (65.4 kg)    Gen: no distress   Neck: No JVD Cardiac: No Rubs or Gallops, no murmur, RRR +2 radial pulses Respiratory: Clear to auscultation bilaterally, normal effort, normal  respiratory rate GI: Soft, nontender, non-distended  MS: No  edema;  moves all extremities Integument: Skin feels well Neuro:  At time of evaluation, alert and oriented to person/place/time/situation  Psych: Normal affect, patient feels well     ASSESSMENT:    1. HFrEF (heart failure with reduced ejection fraction) (HCC)   2. S/P coronary artery stent placement   3. NSVT (nonsustained ventricular tachycardia) (HCC)   4. CAD in native artery   5. Primary hypertension   6. Ischemic cardiomyopathy   7. Tobacco abuse     PLAN:     Heart Failure Reduced Ejection Fraction (combined systolic and diastolic) HTN - Chronic - NYHA class I, Stage C, euvolemic, etiology from ischemia - Diuretic regimen: NA  - Coreg 6.25 mg PO BID with no room to increase - Losartan 12.mg PO daily with no room to increase - SGLT2i with Farxiga caused HA; BMP today; we discussed risks  and benefits of Inpefa - on highest tolerated MRA  Coronary Artery Disease; Obstructive with prior LCX PCI - asymptomatic - continue ASA 81 mg - resending lipids; LDL goal < 55; may restart statin - continue BB (prior NSVT, HFrEF) - continue nitrates; no PRN Nitro needed since DC in 2020  Tobacco abuse - her boyfriend also smokes - we had discussed cessation strategies, education video view prior to clinic DC - if no improvement when calling back labs; we discussed referral to health coaching.  One year with me    Medication Adjustments/Labs and Tests Ordered: Current medicines are reviewed at length with the patient today.  Concerns regarding medicines are outlined above.  Orders Placed This Encounter  Procedures   Basic metabolic panel   Lipid panel   No orders of the defined types were placed in this encounter.   Patient Instructions  Medication Instructions:  Your physician recommends that you continue on your current medications as directed. Please refer to the Current Medication list given to you today.  *If you need a refill on your cardiac medications before your next appointment, please call your pharmacy*   Lab Work: FLP, BMP  If you have labs (blood work) drawn today and your tests are completely normal, you will receive your results only by: MyChart Message (if you have MyChart) OR A paper copy in the mail If you have any lab test that is abnormal or we need to change your treatment, we will call you to review the results.   Testing/Procedures: NONE   Follow-Up: At [facility], you and your health needs are our priority.  As part of our continuing mission to provide you with exceptional heart care, we have created designated Provider Care Teams.  These Care Teams include your primary Cardiologist (physician) and Advanced Practice Providers (APPs -  Physician Assistants and Nurse Practitioners) who all work together to provide you with the care  you need, when you need it.    Your next appointment:   1 year(s)  Provider:   Riley Lam, MD or Jari Favre, PA-C or Robin Searing, NP        Signed, Christell Constant, MD  08/12/2022 10:50 AM    [facility]

## 2022-08-12 NOTE — Telephone Encounter (Signed)
Pt had OV with Dr.Chandrasekhar today 08/12/22. Concern addressed at OV.

## 2022-08-13 LAB — BASIC METABOLIC PANEL
BUN/Creatinine Ratio: 21 (ref 9–23)
BUN: 20 mg/dL (ref 6–24)
CO2: 24 mmol/L (ref 20–29)
Calcium: 9.6 mg/dL (ref 8.7–10.2)
Chloride: 101 mmol/L (ref 96–106)
Creatinine, Ser: 0.94 mg/dL (ref 0.57–1.00)
Glucose: 113 mg/dL — ABNORMAL HIGH (ref 70–99)
Potassium: 4.2 mmol/L (ref 3.5–5.2)
Sodium: 138 mmol/L (ref 134–144)
eGFR: 70 mL/min/{1.73_m2} (ref >59)

## 2022-08-13 LAB — LIPID PANEL
Chol/HDL Ratio: 3.5 ratio (ref 0.0–4.4)
Cholesterol, Total: 220 mg/dL — ABNORMAL HIGH (ref 100–199)
HDL: 62 mg/dL (ref >39)
LDL Chol Calc (NIH): 141 mg/dL — ABNORMAL HIGH (ref 0–99)
Triglycerides: 99 mg/dL (ref 0–149)
VLDL Cholesterol Cal: 17 mg/dL (ref 5–40)

## 2022-08-15 ENCOUNTER — Telehealth: Payer: Self-pay

## 2022-08-15 DIAGNOSIS — E7849 Other hyperlipidemia: Secondary | ICD-10-CM

## 2022-08-15 DIAGNOSIS — Z720 Tobacco use: Secondary | ICD-10-CM

## 2022-08-15 DIAGNOSIS — I2510 Atherosclerotic heart disease of native coronary artery without angina pectoris: Secondary | ICD-10-CM

## 2022-08-15 MED ORDER — ROSUVASTATIN CALCIUM 40 MG PO TABS: ORAL_TABLET | ORAL | 3 refills | Status: DC

## 2022-08-15 NOTE — Telephone Encounter (Signed)
-----   Message from Christell Constant, MD sent at 08/14/2022  7:10 PM EDT ----- Results: LDL is much worse than 2021 Plan: Return rosuvastatin 40 mg (prior therapy where she had success), labs in 3 months  Christell Constant, MD

## 2022-08-15 NOTE — Telephone Encounter (Signed)
The patient has been notified of the result and verbalized understanding.  All questions (if any) were answered. Arvid Right Christan Defranco, RN 08/15/2022 2:23 PM   FLP, ALT scheduled for 11/21/22.

## 2022-09-16 ENCOUNTER — Other Ambulatory Visit: Payer: Self-pay | Admitting: Internal Medicine

## 2022-11-21 ENCOUNTER — Ambulatory Visit: Payer: BC Managed Care – PPO

## 2022-11-23 ENCOUNTER — Ambulatory Visit: Payer: BC Managed Care – PPO

## 2023-02-14 ENCOUNTER — Other Ambulatory Visit: Payer: Self-pay

## 2023-02-14 ENCOUNTER — Emergency Department (HOSPITAL_BASED_OUTPATIENT_CLINIC_OR_DEPARTMENT_OTHER)
Admission: EM | Admit: 2023-02-14 | Discharge: 2023-02-14 | Disposition: A | Discharge status: Home / Self Care | Payer: BC Managed Care – PPO | Attending: Emergency Medicine | Admitting: Emergency Medicine

## 2023-02-14 ENCOUNTER — Encounter (HOSPITAL_BASED_OUTPATIENT_CLINIC_OR_DEPARTMENT_OTHER): Payer: Self-pay | Admitting: Emergency Medicine

## 2023-02-14 DIAGNOSIS — I10 Essential (primary) hypertension: Secondary | ICD-10-CM | POA: Diagnosis not present

## 2023-02-14 DIAGNOSIS — I2510 Atherosclerotic heart disease of native coronary artery without angina pectoris: Secondary | ICD-10-CM | POA: Insufficient documentation

## 2023-02-14 DIAGNOSIS — E119 Type 2 diabetes mellitus without complications: Secondary | ICD-10-CM | POA: Diagnosis not present

## 2023-02-14 DIAGNOSIS — F17210 Nicotine dependence, cigarettes, uncomplicated: Secondary | ICD-10-CM | POA: Insufficient documentation

## 2023-02-14 DIAGNOSIS — R519 Headache, unspecified: Secondary | ICD-10-CM

## 2023-02-14 DIAGNOSIS — J019 Acute sinusitis, unspecified: Secondary | ICD-10-CM | POA: Insufficient documentation

## 2023-02-14 MED ORDER — NAPROXEN 250 MG PO TABS
500.0000 mg | ORAL_TABLET | Freq: Once | ORAL | Status: AC
  Administered 2023-02-14: 500 mg via ORAL
  Filled 2023-02-14: qty 2

## 2023-02-14 MED ORDER — DOXYCYCLINE HYCLATE 100 MG PO TABS
100.0000 mg | ORAL_TABLET | Freq: Once | ORAL | Status: AC
  Administered 2023-02-14: 100 mg via ORAL
  Filled 2023-02-14: qty 1

## 2023-02-14 MED ORDER — DOXYCYCLINE HYCLATE 100 MG PO CAPS: 100.0000 mg | ORAL_CAPSULE | Freq: Two times a day (BID) | ORAL | 0 refills | Status: AC

## 2023-02-14 MED ORDER — NAPROXEN 500 MG PO TABS: 500.0000 mg | ORAL_TABLET | Freq: Two times a day (BID) | ORAL | 0 refills | Status: DC

## 2023-02-14 MED ORDER — BACITRACIN-POLYMYXIN B 500-10000 UNIT/GM OP OINT: 1.0000 | TOPICAL_OINTMENT | Freq: Two times a day (BID) | OPHTHALMIC | 0 refills | Status: AC

## 2023-02-14 NOTE — Discharge Instructions (Signed)
You were evaluated in the Emergency Department and after careful evaluation, we did not find any emergent condition requiring admission or further testing in the hospital.  Your exam/testing today is overall reassuring.  Symptoms may be due to a sinus infection.  Take the Naprosyn twice daily for pain.  Use the doxycycline twice daily, follow-up with your primary care doctor.  Continue the Flonase.  Please return to the Emergency Department if you experience any worsening of your condition.   Thank you for allowing [place] to be a part of your care.

## 2023-02-14 NOTE — ED Triage Notes (Signed)
Pt reports she has sinus allergies and reports it has caused her a headache x 1 week now. Took tylenol w/ no relief.

## 2023-02-14 NOTE — ED Provider Notes (Signed)
[facility]ENCY DEPT [facility] Emergency Department Provider Note MRN:  [id number]  Arrival date & time: 02/14/23     Chief Complaint   Headache   History of Present Illness   Brooke Williamson is a 57 y.o. year-old female with a history of CAD, hypertension presenting to the ED with chief complaint of headache.  Persistent sinus congestion the past several days, getting a bit better with Flonase but now having lots of sinus pressure and headache in the front of the face and sinuses, worse when bending over, can feel the pressure in her teeth.  Wants some relief.  Symptoms for 1 week.  No fever.  No numbness or weakness to the arms or legs, no nausea or vomiting.  Review of Systems  A thorough review of systems was obtained and all systems are negative except as noted in the HPI and PMH.   Patient's Health History    Past Medical History:  Diagnosis Date   CAD (coronary artery disease)    a. acute cath showed occlusion of large branching first obtuse marginal. She underwent Successful PCI and stent implantation in the proximal to mid first obtuse marginal. Distal embolization of thrombus was noted into the inferior most branch of the first obtuse marginal. LM, LAD, and RCA were normal. LVEF was 35-45% by cath 40-45% by echo.   Diabetes mellitus type 2 in nonobese [facility])    GERD (gastroesophageal reflux disease)    Hypercholesteremia    Hypertension    Ischemic cardiomyopathy    NSVT (nonsustained ventricular tachycardia) (HCC)    ST elevation myocardial infarction (STEMI) of inferior wall (HCC) 03/2018   Thyroid disease    Tobacco abuse    Ulcer     Past Surgical History:  Procedure Laterality Date   CORONARY/GRAFT ACUTE MI REVASCULARIZATION N/A 03/23/2018   Procedure: Coronary/Graft Acute MI Revascularization;  Surgeon: Lyn Records, MD;  Location: [facility] IN[facility] LAB;  Service: Cardiovascular;  Laterality: N/A;   LEFT HEART CATH AND CORONARY ANGIOGRAPHY N/A  03/23/2018   Procedure: LEFT HEART CATH AND CORONARY ANGIOGRAPHY;  Surgeon: Lyn Records, MD;  Location: [facility] INVASIVE CV LAB;  Service: Cardiovascular;  Laterality: N/A;   THYROID SURGERY      Family History  Problem Relation Age of Onset   High blood pressure Mother    Heart attack Father    Heart attack Brother        x 2 age 76 and 48/crack cocaine/in prison   High blood pressure Brother    Hyperlipidemia Brother     Social History   Socioeconomic History   Marital status: Single    Spouse name: Not on file   Number of children: Not on file   Years of education: Not on file   Highest education level: Not on file  Occupational History   Not on file  Tobacco Use   Smoking status: Every Day    Types: Cigarettes   Smokeless tobacco: Never  Vaping Use   Vaping status: Never Used  Substance and Sexual Activity   Alcohol use: Not Currently    Comment: occ   Drug use: No   Sexual activity: Not on file  Other Topics Concern   Not on file  Social History Narrative   Not on file   Social Determinants of Health   Financial Resource Strain: Low Risk  (11/28/2022)   Received from [facility]   Overall Financial Resource Strain (CARDIA)    Difficulty of Paying  Living Expenses: Not hard at all  Food Insecurity: Not on File (12/08/2022)   Received from [facility] Insecurity    Food: 0  Transportation Needs: No Transportation Needs (11/28/2022)   Received from [facility] - Transportation    Lack of Transportation (Medical): No    Lack of Transportation (Non-Medical): No  Physical Activity: Insufficiently Active (11/28/2022)   Received from [facility]   Exercise Vital Sign    Days of Exercise per Week: 3 days    Minutes of Exercise per Session: 30 min  Stress: No Stress Concern Present (11/28/2022)   Received from [facility] - Occupational Stress Questionnaire    Feeling of Stress : Not at all  Social  Connections: Not on File (12/08/2022)   Received from [facility]   Social Connections    Connectedness: 0  Intimate Partner Violence: Not At Risk (11/28/2022)   Received from [facility]   HITS    Over the last 12 months how often did your partner physically hurt you?: Never    Over the last 12 months how often did your partner insult you or talk down to you?: Never    Over the last 12 months how often did your partner threaten you with physical harm?: Never    Over the last 12 months how often did your partner scream or curse at you?: Never     Physical Exam   Vitals:   02/14/23 0056  BP: 128/78  Pulse: 71  Resp: 18  Temp: 98.9 F (37.2 C)  SpO2: 100%    CONSTITUTIONAL: Well-appearing, NAD NEURO/PSYCH:  Alert and oriented x 3, no focal deficits EYES:  eyes equal and reactive ENT/NECK:  no LAD, no JVD CARDIO: Regular rate, well-perfused, normal S1 and S2 PULM:  CTAB no wheezing or rhonchi GI/GU:  non-distended, non-tender MSK/SPINE:  No gross deformities, no edema SKIN:  no rash, atraumatic   *Additional and/or pertinent findings included in MDM below  Diagnostic and Interventional Summary    EKG Interpretation Date/Time:    Ventricular Rate:    PR Interval:    QRS Duration:    QT Interval:    QTC Calculation:   R Axis:      Text Interpretation:         Labs Reviewed - No data to display  No orders to display    Medications  naproxen (NAPROSYN) tablet 500 mg (has no administration in time range)  doxycycline (VIBRA-TABS) tablet 100 mg (has no administration in time range)     Procedures  /  Critical Care Procedures  ED Course and Medical Decision Making  Initial Impression and Ddx History and exam would suggest headache related to sinus infection.  Pain with palpation of the sinuses.  Also has some redness and irritation and drainage from the right eye for the past day or 2, explains that her granddaughter has pinkeye.  Past medical/surgical history  that increases complexity of ED encounter: None  Interpretation of Diagnostics Laboratory and/or imaging options to aid in the diagnosis/care of the patient were considered.  After careful history and physical examination, it was determined that there was no indication for diagnostics at this time.  Patient Reassessment and Ultimate Disposition/Management     Patient has normal and symmetric strength and sensation, normal coordination, normal speech, good explanation for headache, no indication for CNS imaging at this time.  Appropriate for discharge.  Patient management required discussion  with the following services or consulting groups:  None  Complexity of Problems Addressed Acute complicated illness or Injury  Additional Data Reviewed and Analyzed Further history obtained from: Prior labs/imaging results  Additional Factors Impacting ED Encounter Risk Prescriptions  Elmer Sow. Pilar Plate, MD [facility] Emergency Medicine [facility] [email or web address]  Final Clinical Impressions(s) / ED Diagnoses     ICD-10-CM   1. Acute sinusitis, recurrence not specified, unspecified location  J01.90     2. Sinus headache  R51.9       ED Discharge Orders          Ordered    doxycycline (VIBRAMYCIN) 100 MG capsule  2 times daily        02/14/23 0206    naproxen (NAPROSYN) 500 MG tablet  2 times daily        02/14/23 0206    bacitracin-polymyxin b (POLYSPORIN) ophthalmic ointment  Every 12 hours        02/14/23 0208             Discharge Instructions Discussed with and Provided to Patient:    Discharge Instructions      You were evaluated in the Emergency Department and after careful evaluation, we did not find any emergent condition requiring admission or further testing in the hospital.  Your exam/testing today is overall reassuring.  Symptoms may be due to a sinus infection.  Take the Naprosyn twice daily for pain.  Use the doxycycline twice daily,  follow-up with your primary care doctor.  Continue the Flonase.  Please return to the Emergency Department if you experience any worsening of your condition.   Thank you for allowing [place] to be a part of your care.      Sabas Sous, MD 02/14/23 [phone]

## 2023-02-16 ENCOUNTER — Emergency Department (HOSPITAL_BASED_OUTPATIENT_CLINIC_OR_DEPARTMENT_OTHER)
Admission: EM | Admit: 2023-02-16 | Discharge: 2023-02-16 | Disposition: A | Discharge status: Home / Self Care | Payer: BC Managed Care – PPO | Attending: Emergency Medicine | Admitting: Emergency Medicine

## 2023-02-16 ENCOUNTER — Other Ambulatory Visit: Payer: Self-pay

## 2023-02-16 DIAGNOSIS — F17210 Nicotine dependence, cigarettes, uncomplicated: Secondary | ICD-10-CM | POA: Insufficient documentation

## 2023-02-16 DIAGNOSIS — I509 Heart failure, unspecified: Secondary | ICD-10-CM | POA: Insufficient documentation

## 2023-02-16 DIAGNOSIS — I110 Hypertensive heart disease with heart failure: Secondary | ICD-10-CM | POA: Diagnosis not present

## 2023-02-16 DIAGNOSIS — Z20822 Contact with and (suspected) exposure to covid-19: Secondary | ICD-10-CM | POA: Insufficient documentation

## 2023-02-16 DIAGNOSIS — R519 Headache, unspecified: Secondary | ICD-10-CM | POA: Diagnosis present

## 2023-02-16 DIAGNOSIS — Z79899 Other long term (current) drug therapy: Secondary | ICD-10-CM | POA: Insufficient documentation

## 2023-02-16 DIAGNOSIS — Z7982 Long term (current) use of aspirin: Secondary | ICD-10-CM | POA: Insufficient documentation

## 2023-02-16 DIAGNOSIS — I2510 Atherosclerotic heart disease of native coronary artery without angina pectoris: Secondary | ICD-10-CM | POA: Diagnosis not present

## 2023-02-16 DIAGNOSIS — E119 Type 2 diabetes mellitus without complications: Secondary | ICD-10-CM | POA: Diagnosis not present

## 2023-02-16 LAB — RESP PANEL BY RT-PCR (RSV, FLU A&B, COVID)  RVPGX2
Influenza A by PCR: NEGATIVE
Influenza B by PCR: NEGATIVE
Resp Syncytial Virus by PCR: NEGATIVE
SARS Coronavirus 2 by RT PCR: NEGATIVE

## 2023-02-16 MED ORDER — ACETAMINOPHEN 500 MG PO TABS
1000.0000 mg | ORAL_TABLET | Freq: Once | ORAL | Status: AC
  Administered 2023-02-16: 1000 mg via ORAL
  Filled 2023-02-16: qty 2

## 2023-02-16 MED ORDER — BUTALBITAL-APAP-CAFFEINE 50-325-40 MG PO TABS: 1.0000 | ORAL_TABLET | Freq: Four times a day (QID) | ORAL | 0 refills | Status: DC | PRN

## 2023-02-16 MED ORDER — OXYMETAZOLINE HCL 0.05 % NA SOLN: 1.0000 | Freq: Two times a day (BID) | NASAL | 0 refills | Status: AC

## 2023-02-16 MED ORDER — KETOROLAC TROMETHAMINE 60 MG/2ML IM SOLN
30.0000 mg | Freq: Once | INTRAMUSCULAR | Status: AC
Start: 2023-02-16 — End: 2023-02-16
  Administered 2023-02-16: 30 mg via INTRAMUSCULAR
  Filled 2023-02-16: qty 2

## 2023-02-16 NOTE — Discharge Instructions (Addendum)
It was a pleasure caring for you today in the emergency department.  Consider performing sinus rinse twice daily.  Take medications as prescribed.  Follow-up with your PCP  Please return to the emergency department for any worsening or worrisome symptoms.

## 2023-02-16 NOTE — ED Provider Notes (Addendum)
[place] EMERGENCY DEPARTMENT AT [facility] Provider Note  CSN: [id number] Arrival date & time: 02/16/23 0018  Chief Complaint(s) Headache  HPI Brooke Williamson is a 57 y.o. female with past medical history as below, significant for CAD, DM, GERD, HLD, HTN, ischemic cardiomyopathy, tobacco use, HFrEF who presents to the ED with complaint of headache, sinus pressure  Patient here with left-sided headache, sinus congestion and pressure.  She was seen 12/3, started on doxycycline and Naprosyn and discharged in stable condition.  She returns today because her headache has returned.  She took 1 dose of Naprosyn and 1 regular strength Tylenol around 9 PM and symptoms did not resolve.  Reports today pressure, throbbing sensation to left side of her face and forehead.  She is having some ongoing congestion, postnasal drip.  No fevers or chills.  No neck pain, no vision or hearing changes, numbness or weakness to extremities.  No falls or head injuries.  Past Medical History Past Medical History:  Diagnosis Date   CAD (coronary artery disease)    a. acute cath showed occlusion of large branching first obtuse marginal. She underwent Successful PCI and stent implantation in the proximal to mid first obtuse marginal. Distal embolization of thrombus was noted into the inferior most branch of the first obtuse marginal. LM, LAD, and RCA were normal. LVEF was 35-45% by cath 40-45% by echo.   Diabetes mellitus type 2 in nonobese [facility])    GERD (gastroesophageal reflux disease)    Hypercholesteremia    Hypertension    Ischemic cardiomyopathy    NSVT (nonsustained ventricular tachycardia) (HCC)    ST elevation myocardial infarction (STEMI) of inferior wall (HCC) 03/2018   Thyroid disease    Tobacco abuse    Ulcer    Patient Active Problem List   Diagnosis Date Noted   HFrEF (heart failure with reduced ejection fraction) (HCC) 08/12/2022   S/P coronary artery stent placement 08/12/2022    CAD in native artery 03/25/2018   Ischemic cardiomyopathy 03/25/2018   Abnormal TSH 03/25/2018   NSVT (nonsustained ventricular tachycardia) (HCC) 03/25/2018   Tobacco abuse 03/25/2018   Diabetes mellitus type 2 in nonobese (HCC) 03/25/2018   Hypertension 03/23/2018   Home Medication(s) Prior to Admission medications   Medication Sig Start Date End Date Taking? Authorizing Provider  butalbital-acetaminophen-caffeine (FIORICET) 50-325-40 MG tablet Take 1 tablet by mouth every 6 (six) hours as needed for up to 10 doses for headache. 02/16/23  Yes Tanda Rockers A, DO  oxymetazoline (AFRIN NASAL SPRAY) 0.05 % nasal spray Place 1-2 sprays into both nostrils 2 (two) times daily for 3 days. 02/16/23 02/19/23 Yes Sloan Leiter, DO  aspirin EC 81 MG tablet Take 1 tablet (81 mg total) by mouth daily. Swallow whole. 02/10/22   Lyn Records, MD  bacitracin-polymyxin b (POLYSPORIN) ophthalmic ointment Place 1 Application into the left eye every 12 (twelve) hours for 5 days. apply to eye every 12 hours while awake 02/14/23 02/19/23  Sabas Sous, MD  carvedilol (COREG) 6.25 MG tablet Take 6.25 mg by mouth 2 (two) times daily. 07/17/22   [provider]  cetirizine (ZYRTEC) 10 MG tablet Take 10 mg by mouth as needed for allergies. 03/05/21   [provider]  doxycycline (VIBRAMYCIN) 100 MG capsule Take 1 capsule (100 mg total) by mouth 2 (two) times daily for 7 days. 02/14/23 02/21/23  Sabas Sous, MD  fenofibrate (TRICOR) 48 MG tablet daily. 08/02/22   [provider]  fluticasone (FLONASE) 50 MCG/ACT nasal spray Place 1 spray into both nostrils as needed for rhinitis or allergies. 03/05/21   [provider]  levothyroxine (SYNTHROID) 75 MCG tablet Take 75 mcg by mouth daily before breakfast.  11/22/17   [provider]  losartan (COZAAR) 25 MG tablet Take 0.5 tablets (12.5 mg total) by mouth daily. 03/21/22   Lyn Records, MD  metoprolol succinate (TOPROL-XL)  50 MG 24 hr tablet Take 1 tablet (50 mg total) by mouth daily. Take with or immediately following a meal. Patient not taking: Reported on 08/12/2022 03/29/22   Lutgen, Megan E, RPH-CPP  naproxen (NAPROSYN) 500 MG tablet Take 1 tablet (500 mg total) by mouth 2 (two) times daily. 02/14/23   Sabas Sous, MD  nitroGLYCERIN (NITROSTAT) 0.4 MG SL tablet Place 1 tablet (0.4 mg total) under the tongue every 5 (five) minutes as needed for chest pain (Up to 3 doses. If taking 3rd dose call 911). 02/20/20 08/12/22  Lyn Records, MD  Omega-3 Fatty Acids (FISH OIL) 1000 MG CAPS Take 1,000 mg by mouth 2 (two) times daily.    [provider]  rosuvastatin (CRESTOR) 40 MG tablet TAKE 1 TABLET(40 MG) BY MOUTH DAILY 08/15/22   Chandrasekhar, Mahesh A, MD  spironolactone (ALDACTONE) 25 MG tablet TAKE 1/2 TABLET(12.5 MG) BY MOUTH DAILY 09/16/22   Christell Constant, MD                                                                                                                                    Past Surgical History Past Surgical History:  Procedure Laterality Date   CORONARY/GRAFT ACUTE MI REVASCULARIZATION N/A 03/23/2018   Procedure: Coronary/Graft Acute MI Revascularization;  Surgeon: Lyn Records, MD;  Location[facility] IN[facility] CV [facility];  Service: Cardiovascular;  Laterality: N/A;   LEFT HEART CATH AND CORONARY ANGIOGRAPHY N/A 03/23/2018   Procedure: LEFT HEART CATH AND CORONARY ANGIOGRAPHY;  Surgeon: Lyn Records, MD;  Location: [facility] INVAS[facility] CV LAB;  Service: Cardiovascular;  Laterality: N/A;   THYROID SURGERY     Family History Family History  Problem Relation Age of Onset   High blood pressure Mother    Heart attack Father    Heart attack Brother        x 2 age 93 and 48/crack cocaine/in prison   High blood pressure Brother    Hyperlipidemia Brother     Social History Social History   Tobacco Use   Smoking status: Every Day    Types: Cigarettes   Smokeless tobacco: Never  Vaping Use    Vaping status: Never Used  Substance Use Topics   Alcohol use: Not Currently    Comment: occ   Drug use: No   Allergies Nitrofurantoin monohyd macro, Atorvastatin, Dapagliflozin, Simvastatin, Ciprofloxacin, Metoprolol, and Penicillins  Review of Systems Review of Systems  Constitutional:  Negative for chills and fever.  HENT:  Positive  for congestion, postnasal drip, sinus pressure and sinus pain. Negative for ear pain and trouble swallowing.   Eyes:  Negative for photophobia and visual disturbance.  Respiratory:  Negative for shortness of breath.   Cardiovascular:  Negative for chest pain.  Gastrointestinal:  Negative for vomiting.  Neurological:  Positive for headaches.  All other systems reviewed and are negative.   Physical Exam Vital Signs  I have reviewed the triage vital signs BP 111/73   Pulse 65   Temp 97.9 F (36.6 C)   Resp 16   Ht 5' (1.524 m)   Wt 28.3 kg   LMP 03/29/2016   SpO2 100%   BMI 12.19 kg/m  Physical Exam Vitals and nursing note reviewed.  Constitutional:      General: She is not in acute distress.    Appearance: Normal appearance.  HENT:     Head: Normocephalic and atraumatic. No raccoon eyes, Battle's sign, right periorbital erythema or left periorbital erythema.     Jaw: There is normal jaw occlusion.      Comments: Sinuses ttp left     Right Ear: External ear normal.     Left Ear: External ear normal.     Nose: Nose normal.     Mouth/Throat:     Mouth: Mucous membranes are moist.  Eyes:     General: Vision grossly intact. Gaze aligned appropriately. No scleral icterus.       Right eye: No discharge.        Left eye: No discharge.     Extraocular Movements: Extraocular movements intact.     Right eye: Normal extraocular motion.     Left eye: Normal extraocular motion.     Pupils: Pupils are equal, round, and reactive to light.  Neck:     Comments: No meningismus Cardiovascular:     Rate and Rhythm: Normal rate and regular rhythm.      Pulses: Normal pulses.  Pulmonary:     Effort: Pulmonary effort is normal. No respiratory distress.     Breath sounds: No stridor.  Abdominal:     General: Abdomen is flat. There is no distension.     Palpations: Abdomen is soft.  Musculoskeletal:     Cervical back: No rigidity.     Right lower leg: No edema.     Left lower leg: No edema.  Skin:    General: Skin is warm and dry.     Capillary Refill: Capillary refill takes less than 2 seconds.  Neurological:     Mental Status: She is alert and oriented to person, place, and time.     GCS: GCS eye subscore is 4. GCS verbal subscore is 5. GCS motor subscore is 6.     Cranial Nerves: Cranial nerves 2-12 are intact. No dysarthria or facial asymmetry.     Sensory: Sensation is intact.     Motor: Motor function is intact.     Coordination: Coordination is intact.     Gait: Gait is intact.  Psychiatric:        Mood and Affect: Mood normal.        Behavior: Behavior normal. Behavior is cooperative.     ED Results and Treatments Labs (all labs ordered are listed, but only abnormal results are displayed) Labs Reviewed  RESP PANEL BY RT-PCR (RSV, FLU A&B, COVID)  RVPGX2  Radiology No results found.  Pertinent labs & imaging results that were available during my care of the patient were reviewed by me and considered in my medical decision making (see MDM for details).  Medications Ordered in ED Medications  ketorolac (TORADOL) injection 30 mg (30 mg Intramuscular Given 02/16/23 0218)  acetaminophen (TYLENOL) tablet 1,000 mg (1,000 mg Oral Given 02/16/23 0218)                                                                                                                                     Procedures Procedures  (including critical care time)  Medical Decision Making / ED Course    Medical Decision  Making:    ARRIELLE DOFFLEMYER is a 57 y.o. female with past medical history as below, significant for CAD, DM, GERD, HLD, HTN, ischemic cardiomyopathy, tobacco use, HFrEF who presents to the ED with complaint of headache, sinus pressure. The complaint involves an extensive differential diagnosis and also carries with it a high risk of complications and morbidity.  Serious etiology was considered. Ddx includes but is not limited to: Differential diagnosis includes but is not exclusive to subarachnoid hemorrhage, meningitis, encephalitis, previous head trauma, cavernous venous thrombosis, muscle tension headache, glaucoma, temporal arteritis, migraine or migraine equivalent, etc.   Complete initial physical exam performed, notably the patient was in no acute distress, sitting upright on stretcher, neuroexam is nonfocal.    Reviewed and confirmed nursing documentation for past medical history, family history, social history.  Vital signs reviewed.    Patient here with headache, sinus pressure and congestion.  Check RVP, give analgesics, reassess  Clinical Course as of 02/16/23 0424  Thu Feb 16, 2023  0329 Symptoms resolved [SG]    Clinical Course User Index [SG] Sloan Leiter, DO    Brief summary: 57 year old female here with headache, sinus pressure.  Neuroexam is nonfocal.  Headache unrelieved with Naprosyn and Tylenol few hours prior to arrival.  She has been compliant with doxycycline, tolerating well.  Headache returned last night.  Exam is stable.  Headache resolved with Toradol.  Her exam is stable, neuro nonfocal.  Favor likely sinus headache.  She is currently being treated for sinusitis   Patient presents with headache. Based on the patient's history and physical there is very low clinical suspicion for significant intracranial pathology. The headache was not sudden onset, not maximal at onset, there are no neurologic findings on exam, the patient does not have a fever, the patient  does not have any jaw claudication, the patient does not endorse a clotting disorder, patient denies any trauma or eye pain and the headache is not associated with dizziness, weakness on one side of the body, diplopia, vertigo, slurred speech, or ataxia. Given the extremely low risk of these diagnoses further testing and evaluation for these possibilities does not appear to be indicated at this time.   The patient improved significantly and was  discharged in stable condition. Detailed discussions were had with the patient regarding current findings, and need for close f/u with PCP or on call doctor. The patient has been instructed to return immediately if the symptoms worsen in any way for re-evaluation. Patient verbalized understanding and is in agreement with current care plan. All questions answered prior to discharge.           Additional history obtained: -Additional history obtained from na -External records from outside source obtained and reviewed including: Chart review including previous notes, labs, imaging, consultation notes including  ED visit recent Home medications   Lab Tests: -I ordered, reviewed, and interpreted labs.   The pertinent results include:   Labs Reviewed  RESP PANEL BY RT-PCR (RSV, FLU A&B, COVID)  RVPGX2    Notable for neg swab  EKG   EKG Interpretation Date/Time:    Ventricular Rate:    PR Interval:    QRS Duration:    QT Interval:    QTC Calculation:   R Axis:      Text Interpretation:           Imaging Studies ordered: na   Medicines ordered and prescription drug management: Meds ordered this encounter  Medications   ketorolac (TORADOL) injection 30 mg   acetaminophen (TYLENOL) tablet 1,000 mg   butalbital-acetaminophen-caffeine (FIORICET) 50-325-40 MG tablet    Sig: Take 1 tablet by mouth every 6 (six) hours as needed for up to 10 doses for headache.    Dispense:  10 tablet    Refill:  0   oxymetazoline (AFRIN NASAL SPRAY)  0.05 % nasal spray    Sig: Place 1-2 sprays into both nostrils 2 (two) times daily for 3 days.    Dispense:  15 mL    Refill:  0    -I have reviewed the patients home medicines and have made adjustments as needed   Consultations Obtained: na   Cardiac Monitoring: Continuous pulse oximetry interpreted by myself, 100% on RA.    Social Determinants of Health:  Diagnosis or treatment significantly limited by social determinants of health: current smoker   Reevaluation: After the interventions noted above, I reevaluated the patient and found that they have improved  Co morbidities that complicate the patient evaluation  Past Medical History:  Diagnosis Date   CAD (coronary artery disease)    a. acute cath showed occlusion of large branching first obtuse marginal. She underwent Successful PCI and stent implantation in the proximal to mid first obtuse marginal. Distal embolization of thrombus was noted into the inferior most branch of the first obtuse marginal. LM, LAD, and RCA were normal. LVEF was 35-45% by cath 40-45% by echo.   Diabetes mellitus type 2 in nonobese (HCC)    GERD (gastroesophageal reflux disease)    Hypercholesteremia    Hypertension    Ischemic cardiomyopathy    NSVT (nonsustained ventricular tachycardia) (HCC)    ST elevation myocardial infarction (STEMI) of inferior wall (HCC) 03/2018   Thyroid disease    Tobacco abuse    Ulcer       Dispostion: Disposition decision including need for hospitalization was considered, and patient discharged from emergency department.    Final Clinical Impression(s) / ED Diagnoses Final diagnoses:  Sinus headache        Sloan Leiter, DO 02/16/23 0424    Sloan Leiter, DO 02/16/23 0424

## 2023-02-16 NOTE — ED Triage Notes (Signed)
Pt was seen 2 days ago for HA and sinusitis. DC with ABX. Has been taking meds has not had any relief. Pt still has HA.

## 2023-03-06 ENCOUNTER — Other Ambulatory Visit: Payer: Self-pay

## 2023-03-06 MED ORDER — CARVEDILOL 6.25 MG PO TABS: 6.2500 mg | ORAL_TABLET | Freq: Two times a day (BID) | ORAL | 1 refills | Status: DC

## 2023-03-16 ENCOUNTER — Other Ambulatory Visit: Payer: Self-pay

## 2023-03-16 MED ORDER — LOSARTAN POTASSIUM 25 MG PO TABS: 12.5000 mg | ORAL_TABLET | Freq: Every day | ORAL | 1 refills | Status: AC

## 2023-09-09 ENCOUNTER — Other Ambulatory Visit: Payer: Self-pay | Admitting: Internal Medicine

## 2023-10-01 NOTE — Progress Notes (Deleted)
  Cardiology Office Note   Date:  10/01/2023  ID:  Reanna, Scoggin [birth year], MRN [id number] PCP: Lesa Jon HERO, PA  [facility] Providers Cardiologist:  Victory LELON Claudene DOUGLAS, MD (Inactive) { Click to update primary MD,subspecialty MD or APP then REFRESH:1}    History of Present Illness Brooke Williamson is a 58 y.o. female with a past medical history significant for HTN, HLD, DM 2, systolic and diastolic heart failure (LVEF 40 to 45% in 2021), tobacco abuse, GERD, PUD, and thyroid  disease as well as remote lateral STEMI treated with DES on 03/2018.  She is here for follow-up appointment.  Patient was last seen in the office May of last year and was doing well.  Her 2020 anginal equivalent was sudden onset nausea.  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  No chest pain or pressure.  No SOB/DOE and no PND/orthopnea.  No weight gain or leg swelling.  No palpitations or syncope.  Stopped Farxiga  due to headache.  Headache then resolved.  PCP put her on fenofibrate, no statin with no change in headache.  Today, she***  ROS: ***  Studies Reviewed      *** Risk Assessment/Calculations {Does this patient have ATRIAL FIBRILLATION?:[id number]} No BP recorded.  {Refresh Note OR Click here to enter BP  :1}***       Physical Exam VS:  LMP 03/29/2016        Wt Readings from Last 3 Encounters:  02/16/23 62 lb 6.4 oz (28.3 kg)  08/12/22 137 lb 9.6 oz (62.4 kg)  03/05/22 150 lb (68 kg)    GEN: Well nourished, well developed in no acute distress NECK: No JVD; No carotid bruits CARDIAC: ***RRR, no murmurs, rubs, gallops RESPIRATORY:  Clear to auscultation without rales, wheezing or rhonchi  ABDOMEN: Soft, non-tender, non-distended EXTREMITIES:  No edema; No deformity   ASSESSMENT AND PLAN HFrEF Status post coronary artery stent placement NSVT CAD HTN Ischemic cardiomyopathy Tobacco abuse     {Are you ordering a CV Procedure (e.g. stress test, cath,  DCCV, TEE, etc)?   Press F2        :[id number]}  Dispo: ***  Signed, Orren LOISE Fabry, PA-C

## 2023-10-03 ENCOUNTER — Ambulatory Visit: Attending: Cardiology | Admitting: Physician Assistant

## 2023-10-07 ENCOUNTER — Other Ambulatory Visit: Payer: Self-pay | Admitting: Internal Medicine

## 2023-11-01 ENCOUNTER — Ambulatory Visit: Attending: Physician Assistant | Admitting: Physician Assistant

## 2023-11-01 NOTE — Progress Notes (Deleted)
   Cardiology Office Note    Date:  11/01/2023  ID:  Brooke Williamson, Brooke Williamson 1965/11/02, MRN [id number] PCP:  Lesa Jon HERO, PA  Cardiologist:  Victory LELON Claudene DOUGLAS, MD (Inactive)  Electrophysiologist:  None   Chief Complaint: ***  History of Present Illness: .    Brooke Williamson is a 58 y.o. female with visit-pertinent history of CAD, chronic HFmrEF, mitral regurgitation HTN, HLD, DM2, tobacco abuse, GERD, PUD, thyroid  disease, NSVT seen for follow-up. Admitted 03/2018 with epigastric pain, found to have inferior STEMI with occlusion of large OM1 treated with PCI/DES. Distal embolization of thrombus was noted into the inferior most branch of the first obtuse marginal. LM, LAD, and RCA were normal. LVEF was 35-45% by cath, 40-45% by echo. Brief NSVT noted on telemetry shortly after MI, no subsequent issues. During that admission is when she was first noted to have DM and thyroid  disease as well, advised to f/u PCP. Last echo 03/2022 EF 45-50%, G1DD, mild-moderate MR.  echo Labs due CMET, CBC, TSH, free T4, lipids F/u PCP SGLT2i?  CAD with HLD Chronic HFmrEF, HTN Mitral regurgitation  Labwork independently reviewed:  07/2023 [facility] K 4.5, Cr 0.77, A1c 6.9, LDL 169, trig 225 03/2023 [facility] LFTs ok 11/2022 [facility] TSH OK 07/2022 LDL 141, trig 99 2021 LFTs ok 2020 TSH 5.150, CBC wnl  ROS: .    Please see the history of present illness. Otherwise, review of systems is positive for ***.  All other systems are reviewed and otherwise negative.  Studies Reviewed: SABRA    EKG:  EKG is ordered today, personally reviewed, demonstrating ***  CV Studies: Cardiac studies reviewed are outlined and summarized above. Otherwise please see EMR for full report.   Current Reported Medications:.    No outpatient medications have been marked as taking for the 11/01/23 encounter (Appointment) with Katlyn Muldrew N, PA-C.    Physical Exam:    VS:  LMP 03/29/2016    Wt  Readings from Last 3 Encounters:  02/16/23 62 lb 6.4 oz (28.3 kg)  08/12/22 137 lb 9.6 oz (62.4 kg)  03/05/22 150 lb (68 kg)    GEN: Well nourished, well developed in no acute distress NECK: No JVD; No carotid bruits CARDIAC: ***RRR, no murmurs, rubs, gallops RESPIRATORY:  Clear to auscultation without rales, wheezing or rhonchi  ABDOMEN: Soft, non-tender, non-distended EXTREMITIES:  No edema; No acute deformity   Asessement and Plan:.     ***     Disposition: F/u with ***  Signed, Junie Avilla N Deshannon Seide, PA-C

## 2024-02-06 ENCOUNTER — Ambulatory Visit: Attending: Internal Medicine | Admitting: Internal Medicine

## 2024-02-06 VITALS — BP 133/84 | HR 82 | Ht 60.0 in | Wt 143.0 lb

## 2024-02-06 DIAGNOSIS — I5020 Unspecified systolic (congestive) heart failure: Secondary | ICD-10-CM | POA: Diagnosis not present

## 2024-02-06 DIAGNOSIS — I2510 Atherosclerotic heart disease of native coronary artery without angina pectoris: Secondary | ICD-10-CM | POA: Diagnosis not present

## 2024-02-06 DIAGNOSIS — E7849 Other hyperlipidemia: Secondary | ICD-10-CM

## 2024-02-06 DIAGNOSIS — Z955 Presence of coronary angioplasty implant and graft: Secondary | ICD-10-CM | POA: Diagnosis not present

## 2024-02-06 DIAGNOSIS — I255 Ischemic cardiomyopathy: Secondary | ICD-10-CM

## 2024-02-06 MED ORDER — DAPAGLIFLOZIN PROPANEDIOL 10 MG PO TABS: 10.0000 mg | ORAL_TABLET | Freq: Every day | ORAL | 3 refills | Status: AC

## 2024-02-06 NOTE — Patient Instructions (Signed)
 Medication Instructions:   START farxiga  10mg  daily  *If you need a refill on your cardiac medications before your next appointment, please call your pharmacy*  Lab Work:  BMET in 1-2 weeks -- with labs thru PCP  If you have labs (blood work) drawn today and your tests are completely normal, you will receive your results only by: MyChart Message (if you have MyChart) OR A paper copy in the mail If you have any lab test that is abnormal or we need to change your treatment, we will call you to review the results.  Follow-Up: At [facility], you and your health needs are our priority.  As part of our continuing mission to provide you with exceptional heart care, our providers are all part of one team.  This team includes your primary Cardiologist (physician) and Advanced Practice Providers or APPs (Physician Assistants and Nurse Practitioners) who all work together to provide you with the care you need, when you need it.  Your next appointment:    12 months with Dr. Santo  We recommend signing up for the patient portal called MyChart.  Sign up information is provided on this After Visit Summary.  MyChart is used to connect with patients for Virtual Visits (Telemedicine).  Patients are able to view lab/test results, encounter notes, upcoming appointments, etc.  Non-urgent messages can be sent to your provider as well.   To learn more about what you can do with MyChart, go to [email or web address].

## 2024-02-06 NOTE — Progress Notes (Signed)
 Cardiology Office Note:    Date:  02/06/2024   ID:  Sharnell, Knight 21-Dec-1965, MRN [id number]  PCP:  Lesa Jon HERO, PA  Cardiologist:  Victory LELON Claudene DOUGLAS, MD (Inactive)   Referring MD: Lesa Jon HERO*   CC: HF eval, CAD mgmt  History of Present Illness:    Brooke Williamson is a 58 y.o. female with a hx of HTN, HLD, DM II, systolic and diastolic heart failure (LVEF 40 to 45% 2021), tobacco abuse, GERD, PUD, & thyroid  disease and remote lateral STEMI treated with DES 03/2018  2023: Working to stop smoking. Saw Dr. Claudene. 2024: Asymptomatic and doing well.   Brooke Williamson is a 58 year old female with hypertension, diabetes, and heart failure with reduced ejection fraction who presents for cardiovascular follow-up.  She has a history of heart failure with mildly reduced ejection fraction, initially 40-45% in 2021, improving to 45-50% by 2024. She experienced a lateral STEMI in 2020, resulting in coronary artery disease and requiring a stent in the circumflex artery. No current symptoms such as chest pain, shortness of breath, or swelling. She has not needed to use nitroglycerin .  She has a history of hypertension and diabetes, which are part of her ongoing medical management.  She experienced headaches in the past, associated with the use of rosuvastatin  at a dose of 40 mg. The dose was reduced to 10 mg, which alleviated the headaches. She has allergies to atorvastatin , simvastatin, and metoprolol , and she previously tolerated Farxiga  until the dose of rosuvastatin  was increased.  Socially, she is a former smoker and is attempting to remain smoke-free. She is actively involved in her grandchild's life, who is 67 years old, and enjoys spending time with her doing various activities.   Past Medical History:  Diagnosis Date   CAD (coronary artery disease)    a. acute cath showed occlusion of large branching first obtuse marginal. She underwent  Successful PCI and stent implantation in the proximal to mid first obtuse marginal. Distal embolization of thrombus was noted into the inferior most branch of the first obtuse marginal. LM, LAD, and RCA were normal. LVEF was 35-45% by cath 40-45% by echo.   Diabetes mellitus type 2 in nonobese [facility])    GERD (gastroesophageal reflux disease)    Hypercholesteremia    Hypertension    Ischemic cardiomyopathy    NSVT (nonsustained ventricular tachycardia) (HCC)    ST elevation myocardial infarction (STEMI) of inferior wall (HCC) 03/2018   Thyroid  disease    Tobacco abuse    Ulcer     Past Surgical History:  Procedure Laterality Date   CORONARY/GRAFT ACUTE MI REVASCULARIZATION N/A 03/23/2018   Procedure: Coronary/Graft Acute MI Revascularization;  Surgeon: Claudene Victory LELON, MD;  Location: [facility] INVASIVE CV LAB;  Service: Cardiovascular;  Laterality: N/A;   LEFT HEART CATH AND CORONARY ANGIOGRAPHY N/A 03/23/2018   Procedure: LEFT HEART CATH AND CORONARY ANGIOGRAPHY;  Surgeon: Claudene Victory LELON, MD;  Location: [facility] INVASIVE CV LAB;  Service: Cardiovascular;  Laterality: N/A;   THYROID  SURGERY      Current Medications: Current Meds  Medication Sig   aspirin  EC 81 MG tablet Take 1 tablet (81 mg total) by mouth daily. Swallow whole.   benzonatate  (TESSALON ) 200 MG capsule Take 200 mg by mouth every 8 (eight) hours.   carvedilol  (COREG ) 6.25 MG tablet TAKE 1 TABLET(6.25 MG) BY MOUTH TWICE DAILY   cetirizine (ZYRTEC) 10 MG tablet Take 10 mg by mouth as needed for  allergies.   dapagliflozin  propanediol (FARXIGA ) 10 MG TABS tablet Take 1 tablet (10 mg total) by mouth daily before breakfast.   fluticasone  (FLONASE ) 50 MCG/ACT nasal spray Place 1 spray into both nostrils as needed for rhinitis or allergies.   ipratropium (ATROVENT) 0.06 % nasal spray Place into both nostrils as needed for rhinitis.   levothyroxine (SYNTHROID) 75 MCG tablet Take 75 mcg by mouth daily before breakfast.    losartan  (COZAAR ) 25 MG  tablet Take 0.5 tablets (12.5 mg total) by mouth daily.   nitroGLYCERIN  (NITROSTAT ) 0.4 MG SL tablet Place 1 tablet (0.4 mg total) under the tongue every 5 (five) minutes as needed for chest pain (Up to 3 doses. If taking 3rd dose call 911).   Omega-3 Fatty Acids (FISH OIL) 1000 MG CAPS Take 1,000 mg by mouth 2 (two) times daily.   omeprazole (PRILOSEC) 20 MG capsule Take 20 mg by mouth daily. (Patient taking differently: Take 20 mg by mouth as needed (heartburn).)   rosuvastatin  (CRESTOR ) 10 MG tablet Take 10 mg by mouth at bedtime.   spironolactone  (ALDACTONE ) 25 MG tablet TAKE 1/2 TABLET(12.5 MG) BY MOUTH DAILY   Vitamin D, Ergocalciferol, (DRISDOL) 1.25 MG (50000 UNIT) CAPS capsule Take 50,000 Units by mouth once a week.     Allergies:   Nitrofurantoin monohyd macro, Atorvastatin , Simvastatin, Ciprofloxacin , Metoprolol , and Penicillins   Social History   Socioeconomic History   Marital status: Single    Spouse name: Not on file   Number of children: Not on file   Years of education: Not on file   Highest education level: Not on file  Occupational History   Not on file  Tobacco Use   Smoking status: Every Day    Types: Cigarettes   Smokeless tobacco: Never  Vaping Use   Vaping status: Never Used  Substance and Sexual Activity   Alcohol use: Not Currently    Comment: occ   Drug use: No   Sexual activity: Not on file  Other Topics Concern   Not on file  Social History Narrative   Not on file   Social Drivers of Health   Financial Resource Strain: Low Risk  (07/27/2023)   Received from [facility]   Overall Financial Resource Strain (CARDIA)    Difficulty of Paying Living Expenses: Not hard at all  Food Insecurity: No Food Insecurity (07/27/2023)   Received from [facility]   Hunger Vital Sign    Within the past 12 months, you worried that your food would run out before you got the money to buy more.: Never true    Within the past 12 months, the food you bought just  didn't last and you didn't have money to get more.: Never true  Transportation Needs: No Transportation Needs (07/27/2023)   Received from [facility] - Transportation    Lack of Transportation (Medical): No    Lack of Transportation (Non-Medical): No  Physical Activity: Insufficiently Active (07/27/2023)   Received from [facility]   Exercise Vital Sign    On average, how many days per week do you engage in moderate to strenuous exercise (like a brisk walk)?: 3 days    On average, how many minutes do you engage in exercise at this level?: 30 min  Stress: No Stress Concern Present (07/27/2023)   Received from [facility] - Occupational Stress Questionnaire    Feeling of Stress : Not at all  Social Connections:  Socially Integrated (07/27/2023)   [facility] from [facility]   Social Network    How would you rate your social network (family, work, friends)?: Good participation with social networks     Family History: The patient's family history includes Heart attack in her brother and father; High blood pressure in her brother and mother; Hyperlipidemia in her brother.  EKGs/Labs/Other Studies Reviewed:    The following studies were reviewed today:  Cardiac Studies & Procedures   ______________________________________________________________________________________________ CARDIAC CATHETERIZATION  CARDIAC CATHETERIZATION 03/23/2018  Conclusion  Acute inferolateral ST elevation myocardial infarction due to occlusion of a large branching first obtuse marginal.  Successful PCI and stent implantation in the proximal to mid first obtuse marginal reducing a 100% stenosis with TIMI grade 0 flow to TIMI grade III and 0% stenosis.  The lesion substrate is a [place] 1, 0, 0 bifurcation with a single stent crossing over the high lateral side branch.  Final balloon diameter 3 mm.  Distal embolization of thrombus is noted into the inferior  most branch of the first obtuse marginal.  The circumflex proximal to and beyond the first obtuse marginal is normal.  Left main is normal  Left anterior descending wraps around the left ventricular apex and is normal.  Right coronary is dominant and normal.  Left ventricular function is reduced and estimated to be 35 to 45%.  LVEDP 17 mmHg.  Inferior and distal anterior wall hypokinesis is noted.  RECOMMENDATIONS:   Aspirin  and Brilinta  x12 months.  Brilinta  could eventually be transitioned to Plavix  for cost purposes.  Smoking cessation  Aggressive risk factor modification also to should include blood pressure control and LDL lowering to less than 50.  Consider cardiac rehab  Discharge based upon clinical progress over the next 36 to 48 hours.  Findings Coronary Findings Diagnostic  Dominance: Right  Left Circumflex  First Obtuse Marginal Branch Vessel is large in size. Ost 1st Mrg lesion is 100% stenosed.  Intervention  Ost 1st Mrg lesion Stent A stent was successfully placed. Post-Intervention Lesion Assessment The intervention was successful. There is a 0% residual stenosis post intervention.     ECHOCARDIOGRAM  ECHOCARDIOGRAM COMPLETE 04/05/2022  Narrative ECHOCARDIOGRAM REPORT    Patient Name:   Brooke Williamson Date of Exam: 04/05/2022 Medical Rec #:  [id number]           Height:       60.0 in Accession #:    [id number]          Weight:       150.0 lb Date of Birth:  1965/07/26            BSA:          1.652 m Patient Age:    56 years            BP:           138/84 mmHg Patient Gender: F                   HR:           71 bpm. Exam Location:  [address]  Procedure: 2D Echo, 3D Echo, Cardiac Doppler and Color Doppler  Indications:    I50.42 CHF  History:        Patient has prior history of Echocardiogram examinations, most recent 05/06/2019. CHF, CAD and Previous Myocardial Infarction, Arrythmias:NSVT; Risk Factors:Family History of Coronary  Artery Disease, Hypertension, Dyslipidemia, Diabetes and Current Smoker. Ischemic Cardiomyopathy (prior EF 40-45%).  Sonographer:  Heather Hawks RDCS Referring Phys: VICTORY ORN [facility]  IMPRESSIONS   1. Left ventricular ejection fraction, by estimation, is 45 to 50%. The left ventricle has mildly decreased function. The left ventricle demonstrates regional wall motion abnormalities (see scoring diagram/findings for description). Left ventricular diastolic parameters are consistent with Grade I diastolic dysfunction (impaired relaxation). 2. Right ventricular systolic function is normal. The right ventricular size is normal. Tricuspid regurgitation signal is inadequate for assessing PA pressure. 3. The mitral valve is normal in structure. Mild to moderate mitral valve regurgitation, splay artifact in parasternal short axis suggests this may be underestimated. No evidence of mitral stenosis. 4. The aortic valve is tricuspid. Aortic valve regurgitation is not visualized. No aortic stenosis is present. 5. The inferior vena cava is normal in size with greater than 50% respiratory variability, suggesting right atrial pressure of 3 mmHg.  FINDINGS Left Ventricle: Left ventricular ejection fraction, by estimation, is 45 to 50%. The left ventricle has mildly decreased function. The left ventricle demonstrates regional wall motion abnormalities. 3D left ventricular ejection fraction analysis performed but not reported based on interpreter judgement due to suboptimal tracking. The left ventricular internal cavity size was normal in size. There is no left ventricular hypertrophy. Left ventricular diastolic parameters are consistent with Grade I diastolic dysfunction (impaired relaxation).   LV Wall Scoring: The mid and distal inferior wall is akinetic. The mid and distal anterior wall, antero-lateral wall, and mid and distal lateral wall are hypokinetic.  Right Ventricle: The right ventricular size is  normal. No increase in right ventricular wall thickness. Right ventricular systolic function is normal. Tricuspid regurgitation signal is inadequate for assessing PA pressure.  Left Atrium: Left atrial size was normal in size.  Right Atrium: Right atrial size was normal in size.  Pericardium: There is no evidence of pericardial effusion.  Mitral Valve: The mitral valve is normal in structure. Mild to moderate mitral valve regurgitation. No evidence of mitral valve stenosis.  Tricuspid Valve: The tricuspid valve is normal in structure. Tricuspid valve regurgitation is trivial. No evidence of tricuspid stenosis.  Aortic Valve: The aortic valve is tricuspid. Aortic valve regurgitation is not visualized. No aortic stenosis is present.  Pulmonic Valve: The pulmonic valve was normal in structure. Pulmonic valve regurgitation is trivial. No evidence of pulmonic stenosis.  Aorta: The aortic root is normal in size and structure.  Venous: The inferior vena cava is normal in size with greater than 50% respiratory variability, suggesting right atrial pressure of 3 mmHg.  IAS/Shunts: No atrial level shunt detected by color flow Doppler.   LEFT VENTRICLE PLAX 2D LVIDd:         4.50 cm   Diastology LVIDs:         3.10 cm   LV e' medial:    6.53 cm/s LV PW:         0.60 cm   LV E/e' medial:  10.1 LV IVS:        0.80 cm   LV e' lateral:   11.10 cm/s LVOT diam:     2.00 cm   LV E/e' lateral: 5.9 LV SV:         55 LV SV Index:   33 LVOT Area:     3.14 cm  3D Volume EF: LV EDV:       144 ml LV ESV:       49 ml LV SV:        95 ml  RIGHT VENTRICLE RV Basal diam:  3.30 cm RV S prime:     10.60 cm/s TAPSE (M-mode): 1.3 cm  LEFT ATRIUM             Index        RIGHT ATRIUM           Index LA diam:        3.40 cm 2.06 cm/m   RA Area:     12.50 cm LA Vol (A2C):   56.2 ml 34.02 ml/m  RA Volume:   29.50 ml  17.86 ml/m LA Vol (A4C):   46.9 ml 28.39 ml/m LA Biplane Vol: 52.5 ml 31.78  ml/m AORTIC VALVE LVOT Vmax:   81.00 cm/s LVOT Vmean:  54.750 cm/s LVOT VTI:    0.176 m  AORTA Ao Root diam: 2.80 cm Ao Asc diam:  2.90 cm  MITRAL VALVE MV Area (PHT): cm         SHUNTS MV Decel Time: 229 msec    Systemic VTI:  0.18 m MV E velocity: 66.03 cm/s  Systemic Diam: 2.00 cm MV A velocity: 89.63 cm/s MV E/A ratio:  0.74  Soyla Merck MD Electronically signed by Soyla Merck MD Signature Date/Time: 04/06/2022/11:40:26 AM    Final    MONITORS  LONG TERM MONITOR (3-14 DAYS) 04/01/2020  Narrative  Basic rhythm is NSR with Ave HR 83 bpm (range 55-146 bpm)  Brief SVT lating 4 beats at 146 bpm  Rare PAC's, and PVC's with burden < 1%  No atrial fibrillation  Poor corelation between triggered events and arrhythmia. On 1 occasion an isolated PVC was noted.  Overall, benign study with no action items.       ______________________________________________________________________________________________      Recent Labs: No results found for requested labs within last 365 days.  Recent Lipid Panel    Component Value Date/Time   CHOL 220 (H) 08/12/2022 1040   TRIG 99 08/12/2022 1040   HDL 62 08/12/2022 1040   CHOLHDL 3.5 08/12/2022 1040   CHOLHDL 3.9 03/23/2018 0443   VLDL 6 03/23/2018 0443   LDLCALC 141 (H) 08/12/2022 1040    Physical Exam:    VS:  BP 133/84   Pulse 82   Ht 5' (1.524 m)   Wt 143 lb (64.9 kg)   LMP 03/29/2016   SpO2 93%   BMI 27.93 kg/m     Wt Readings from Last 3 Encounters:  02/06/24 143 lb (64.9 kg)  02/16/23 62 lb 6.4 oz (28.3 kg)  08/12/22 137 lb 9.6 oz (62.4 kg)    Gen: No distress   Neck: No JVD Cardiac: No Rubs or Gallops, no murmur, RRR +2 radial pulses Respiratory: Clear to auscultation bilaterally, normal effort, normal  respiratory rate GI: Soft, nontender, non-distended  MS: No edema;  moves all extremities Integument: Skin feels well Neuro:  At time of evaluation, alert and oriented to  person/place/time/situation  Psych: Normal affect, patient feels well    ASSESSMENT/PLAN:    Heart failure with mildly reduced ejection fraction and ischemic cardiomyopathy Heart function improved from EF of 40-45% in 2021 to 45-50% in 2024. No symptoms of chest pain, shortness of breath, or edema. No nitroglycerin  use. Considering reintroduction of Farxiga  to potentially improve heart function. Previous headache with rosuvastatin  40 mg resolved with dose reduction to 10 mg. Farxiga  was previously discontinued due to headache, but she is amenable to retrying it. - Restart Farxiga  and monitor for headache. - Ordered basic metabolic panel (BMP) in 1-2 weeks to assess kidney  function and cholesterol levels. - Coordinated with other provider for cholesterol management post-Thanksgiving; they managed HLD - Continue current GDMT  Atherosclerotic heart disease of native coronary artery status post coronary angioplasty with stent Status post lateral STEMI in 2020 with stent placement. No current symptoms of angina or ischemia. T wave inversions in lateral leads have improved since 2020. No recent nitroglycerin  use. Discussed potential for new medications for heart attack prevention if she decides to quit smoking. - Encouraged smoking cessation and offered support with patches if she decides to quit. - no change to therapy at this time - Offered PCSK9i therapy  One year with me or my team  Stanly Leavens, MD FASE [facility] Cardiologist [facility]  [address] [place], [place] [address] [phone]  4:46 PM

## 2024-02-29 ENCOUNTER — Other Ambulatory Visit: Payer: Self-pay | Admitting: Internal Medicine

## 2024-03-05 ENCOUNTER — Other Ambulatory Visit (HOSPITAL_COMMUNITY): Payer: Self-pay

## 2024-03-05 ENCOUNTER — Telehealth: Payer: Self-pay | Admitting: Internal Medicine

## 2024-03-05 DIAGNOSIS — I2510 Atherosclerotic heart disease of native coronary artery without angina pectoris: Secondary | ICD-10-CM

## 2024-03-05 DIAGNOSIS — E7849 Other hyperlipidemia: Secondary | ICD-10-CM

## 2024-03-05 NOTE — Telephone Encounter (Signed)
 Pt c/o medication issue:  1. Name of Medication:   dapagliflozin  propanediol (FARXIGA ) 10 MG TABS tablet    carvedilol  (COREG ) 6.25 MG tablet   2. How are you currently taking this medication (dosage and times per day)? As written   3. Are you having a reaction (difficulty breathing--STAT)? No   4. What is your medication issue? Pt called in stating she has not started this medication yet because she wanted the 5 mg. She also wanted to discuss the price of it for assistance.    She also states after taking carvedilol  she gets a headache.   Please advise.

## 2024-03-05 NOTE — Telephone Encounter (Signed)
 Pt states she wanted to start on 5mg  of Farxiga  because it was causing her headaches before. Previous visit from Dr Primus says start Farxiga  10mg  daily. Pt states that the last time she was prescribed this medicaiton it was very expensive, but the med assist was able to get it down to $16.   Regarding Coreg , she denies any side effects with the morning dose, but when she takes the second dose she gets a severe headache.    Will forward to Dr Primus for advisement. Will also forward to med assistance.

## 2024-03-05 NOTE — Telephone Encounter (Signed)
 Patient is commercially insured so not eligible for PAP with [employer]. Patient should utilize copay card at [email or web address]

## 2024-03-11 NOTE — Telephone Encounter (Signed)
 Called pt, no answer. Left message for her to return the call.

## 2024-03-11 NOTE — Telephone Encounter (Signed)
 Pt called to follow up. Please advise.

## 2024-03-11 NOTE — Telephone Encounter (Signed)
 Left a message to call back.

## 2024-03-12 ENCOUNTER — Other Ambulatory Visit (HOSPITAL_COMMUNITY): Payer: Self-pay

## 2024-03-12 ENCOUNTER — Telehealth: Payer: Self-pay | Admitting: Pharmacy Technician

## 2024-03-12 NOTE — Telephone Encounter (Signed)
 I called [facility] and now it is 15.00 for 10mg  farxiga .

## 2024-03-12 NOTE — Telephone Encounter (Signed)
 I called [facility] and they said it is still showing held up in company secretary. She told me to call back in a couple of hours

## 2024-03-12 NOTE — Telephone Encounter (Signed)
 Brooke Williamson

## 2024-03-12 NOTE — Telephone Encounter (Signed)
 Called and spoke to pt. The Rosuvastatin  is what was causing her headaches. She stopped the medication and now does not have headaches. She gets this med from her PCP. She agrees to see Pharm D for consideration of PCSK9i. She has continued the Carvedilol  and states this medication is not causing her headaches. No changes made due to this.   Her concern now is: She needs a RX for Farxiga  5 mg. Had Labs/lipids drawn at PCP about two weeks ago. She has been cutting the 10 mg tablet in half (taking 5 mg qday). She needs this dose due to cost of medication. In the past she was able to pay $15 for this med but it needs to be the 5 mg dose.    FYI: Best time to reach her: in the mornings OR after 3:00 pm when she leaves work.

## 2024-03-12 NOTE — Telephone Encounter (Signed)
 After speaking to [facility] a couple of times, this is now fixed and the copay is 15.00 for the farxiga  10mg .

## 2024-03-25 ENCOUNTER — Telehealth: Payer: Self-pay | Admitting: Pharmacy Technician

## 2024-03-25 ENCOUNTER — Telehealth: Payer: Self-pay

## 2024-03-25 ENCOUNTER — Ambulatory Visit: Attending: Cardiology

## 2024-03-25 ENCOUNTER — Other Ambulatory Visit (HOSPITAL_COMMUNITY): Payer: Self-pay

## 2024-03-25 DIAGNOSIS — E785 Hyperlipidemia, unspecified: Secondary | ICD-10-CM | POA: Insufficient documentation

## 2024-03-25 NOTE — Telephone Encounter (Signed)
 Please see other encounter.

## 2024-03-25 NOTE — Telephone Encounter (Signed)
 Pharmacy Patient Advocate Encounter   Received notification from Physician's Office that prior authorization for repatha  is required/requested.   Insurance verification completed.   The patient is insured through [employer].   Per test claim: PA required; PA submitted to above mentioned insurance via Latent Key/confirmation #/EOC [id number] Status is pending

## 2024-03-25 NOTE — Progress Notes (Addendum)
 Patient ID: Brooke Williamson                 DOB: [birth year]                    MRN: [id number]      HPI: Brooke Williamson is a 59 y.o. female patient referred to lipid clinic by Dr. Santo. PMH is significant for HFrEF, CAD s/p STEMI treated with DES 03/2018, T2DM, HTN, HLD, and tobacco abuse.  Patient was seen by Dr. Santo in November 2025 for a follow-up visit. Farxiga  was restarted to potentially improve cardiac function, and PCSK9 inhibitor therapy was offered. Tobacco cessation was encouraged with the use of a patch. Patient reports experiencing headaches on current therapy (rosuvastatin ) and is open to discussing PCSK9 inhibitor options with the PharmD.  Today, patient presents in good spirits. She is currently not taking any medications for hyperlipidemia management. She recently discontinued rosuvastatin  one week ago due to headaches, even at a 5 mg daily dose. Given her history of intolerance to atorvastatin  and simvastatin, she is not interested in retrying statin therapy.  We reviewed options for lowering LDL cholesterol, including ezetimibe, PCSK-9 inhibitors, bempedoic acid and inclisiran.  Discussed mechanisms of action, dosing, side effects and potential decreases in LDL cholesterol.  Also reviewed cost information and potential options for patient assistance.   Current Medications: none, stopped rosuvastatin  1 week ago Intolerances: atorvastatin  (chest pain), simvastatin (chest pain), rosuvastatin  (headaches) Risk Factors: HFrEF, CAD s/p STEMI treated with DES 03/2018, T2DM, HTN, and tobacco abuse LDL goal: < 55 Lipid panel (02/2024): Chol 193, Trig 89, HDL 78, LDL 99 Liver enzymes (02/2024): AST 25, ALT 13, Alk phos 53  Diet:  Breakfast: Apple, grapes, and cheese. Lunch/Dinner: Peanut butter and jelly sandwich with chips and a drink; chicken (both fried and baked). Beverages: Water (approximately 5-6 bottles daily)  Exercise:  Walks on the track for  20 minutes daily   Family History:  Relation Problem Comments  Mother (Alive) High blood pressure     Father (Deceased) Heart attack     Sister Metallurgist)   Brother (Alive) Heart attack x 2 age 25 and 48/crack cocaine/in prison  High blood pressure   Hyperlipidemia     Maternal Grandmother (Deceased)   Maternal Grandfather (Deceased)   Paternal Grandmother (Deceased) Diabetes   Paternal Grandfather (Deceased)      Social History:  Alcohol: 2 drinks per week  Smoking: 5 cigarettes daily which is decreased from 10 daily; she is working on reducing tobacco use and is hopeful she will quit before the end of the year is over; offered our services when she is ready to quit  Labs:  Lipid Panel     Component Value Date/Time   CHOL 220 (H) 08/12/2022 1040   TRIG 99 08/12/2022 1040   HDL 62 08/12/2022 1040   CHOLHDL 3.5 08/12/2022 1040   CHOLHDL 3.9 03/23/2018 0443   VLDL 6 03/23/2018 0443   LDLCALC 141 (H) 08/12/2022 1040   LABVLDL 17 08/12/2022 1040    Past Medical History:  Diagnosis Date   CAD (coronary artery disease)    a. acute cath showed occlusion of large branching first obtuse marginal. She underwent Successful PCI and stent implantation in the proximal to mid first obtuse marginal. Distal embolization of thrombus was noted into the inferior most branch of the first obtuse marginal. LM, LAD, and RCA were normal. LVEF was 35-45% by cath 40-45% by echo.   Diabetes mellitus  type 2 in nonobese [facility])    GERD (gastroesophageal reflux disease)    Hypercholesteremia    Hypertension    Ischemic cardiomyopathy    NSVT (nonsustained ventricular tachycardia) (HCC)    ST elevation myocardial infarction (STEMI) of inferior wall (HCC) 03/2018   Thyroid  disease    Tobacco abuse    Ulcer     Medications Ordered Prior to Encounter[1]  Allergies[2]  Assessment/Plan:  1. Hyperlipidemia -  Problem  Hyperlipidemia   Hyperlipidemia Assessment:  LDL goal: <55 mg/dL; most  recent LDL-C was 99 mg/dL (87/7974) on rosuvastatin  10 mg daily. Statin intolerances: Atorvastatin  (chest pain), simvastatin (chest pain), rosuvastatin  (headaches). Discussed next options: ezetimibe, PCSK9 inhibitors, bempedoic acid, and inclisiran, including cost, dosing, efficacy, and potential side effects. Patient willing to proceed with PCSK9i therapy Lifestyle counseling: Encouraged adherence to a heart-healthy diet and regular physical activity. Tobacco use: Patient is not ready to quit smoking at this time but is working on reducing use with a goal of quitting by year-end; offered support services and pharmacotherapy when ready  Plan: Will apply for PA for PCSK9i; will inform patient upon approval; PCSK9i will replace statin Lipid lab due in 3 months after starting PCSK9i     Thank you,  Kayden Hutmacher E. Ziyana Morikawa, Pharm.D, CPP [address] Brooke Williamson. [facility] [address], [place], [place] [address] Phone: [phone]; Fax: [phone]        [1]  Current Outpatient Medications on File Prior to Visit  Medication Sig Dispense Refill   aspirin  EC 81 MG tablet Take 1 tablet (81 mg total) by mouth daily. Swallow whole.     benzonatate  (TESSALON ) 200 MG capsule Take 200 mg by mouth every 8 (eight) hours.     carvedilol  (COREG ) 6.25 MG tablet Take 1 tablet (6.25 mg total) by mouth 2 (two) times daily. 180 tablet 3   cetirizine (ZYRTEC) 10 MG tablet Take 10 mg by mouth as needed for allergies.     dapagliflozin  propanediol (FARXIGA ) 10 MG TABS tablet Take 1 tablet (10 mg total) by mouth daily before breakfast. 90 tablet 3   fluticasone  (FLONASE ) 50 MCG/ACT nasal spray Place 1 spray into both nostrils as needed for rhinitis or allergies.     ipratropium (ATROVENT) 0.06 % nasal spray Place into both nostrils as needed for rhinitis.     levothyroxine (SYNTHROID) 75 MCG tablet Take 75 mcg by mouth daily before breakfast.   1   losartan  (COZAAR ) 25 MG  tablet Take 0.5 tablets (12.5 mg total) by mouth daily. 45 tablet 1   nitroGLYCERIN  (NITROSTAT ) 0.4 MG SL tablet Place 1 tablet (0.4 mg total) under the tongue every 5 (five) minutes as needed for chest pain (Up to 3 doses. If taking 3rd dose call 911). 25 tablet 3   Omega-3 Fatty Acids (FISH OIL) 1000 MG CAPS Take 1,000 mg by mouth 2 (two) times daily.     omeprazole (PRILOSEC) 20 MG capsule Take 20 mg by mouth daily. (Patient taking differently: Take 20 mg by mouth as needed (heartburn).)     spironolactone  (ALDACTONE ) 25 MG tablet TAKE 1/2 TABLET(12.5 MG) BY MOUTH DAILY 45 tablet 3   Vitamin D, Ergocalciferol, (DRISDOL) 1.25 MG (50000 UNIT) CAPS capsule Take 50,000 Units by mouth once a week.     No current facility-administered medications on file prior to visit.  [2]  Allergies Allergen Reactions   Nitrofurantoin Monohyd Macro Shortness Of Breath    rash  Atorvastatin  Other (See Comments)    CHEST PAIN CHEST PAIN    Simvastatin Other (See Comments)    CHEST PAIN CHEST PAIN    Ciprofloxacin     Metoprolol      Severe headache   Penicillins Rash

## 2024-03-25 NOTE — Assessment & Plan Note (Addendum)
 Assessment:  LDL goal: <55 mg/dL; most recent LDL-C was 99 mg/dL (87/7974) on rosuvastatin  10 mg daily. Statin intolerances: Atorvastatin  (chest pain), simvastatin (chest pain), rosuvastatin  (headaches). Discussed next options: ezetimibe, PCSK9 inhibitors, bempedoic acid, and inclisiran, including cost, dosing, efficacy, and potential side effects. Patient willing to proceed with PCSK9i therapy Lifestyle counseling: Encouraged adherence to a heart-healthy diet and regular physical activity. Tobacco use: Patient is not ready to quit smoking at this time but is working on reducing use with a goal of quitting by year-end; offered support services and pharmacotherapy when ready  Plan: Will apply for PA for PCSK9i; will inform patient upon approval; PCSK9i will replace statin Lipid lab due in 3 months after starting PCSK9i

## 2024-03-25 NOTE — Patient Instructions (Addendum)
 Your Results:             Your most recent labs Goal  Total Cholesterol 193 < 200  Triglycerides 89 < 150  HDL (happy/good cholesterol) 78 > 40  LDL (lousy/bad cholesterol 99 < 55   Medication changes: We will start the process to get Repatha /Praluent covered by your insurance.  Once the prior authorization is complete, we will call you to let you know and confirm pharmacy information.    Lab orders:We want to repeat labs 3 months after starting PCSK9i.  We will send you a lab order to remind you once we get        closer to that time.    Brooke Williamson Brooke Williamson, Pharm.D, CPP [place] [facility]. [facility] [address], [place], [place] [address] Phone: [phone]; Fax: [phone]     Praluent is a cholesterol medication that improved your body's ability to get rid of bad cholesterol known as LDL. It can lower your LDL up to 60%. It is an injection that is given under the skin every 2 weeks. The most common side effects of Praluent include runny nose, symptoms of the common cold, rarely flu or flu-like symptoms, back/muscle pain in about 3-4% of the patients, and redness, pain, or bruising at the injection site.  Repatha  is a cholesterol medication that improved your body's ability to get rid of bad cholesterol known as LDL. It can lower your LDL up to 60%! It is an injection that is given under the skin every 2 weeks. The medication often requires a prior authorization from your insurance company. We will take care of submitting all the necessary information to your insurance company to get it approved. The most common side effects of Repatha  include runny nose, symptoms of the common cold, rarely flu or flu-like symptoms, back/muscle pain in about 3-4% of the patients, and redness, pain, or bruising at the injection site.  It is also recommended that patients with high cholesterol adhere to a heart healthy diet, get regular exercise, avoid use of  tobacco products, and maintain a healthy weight. Steps that you can take to help in these areas:  Limit consumption of trans fats, saturated fats, and cholesterol in your diet  Increase intake of lean meats such as chicken, turkey, and fish  Increase intake of foods rich in fiber such as fresh fruits, vegetables, beans and oatmeal Exercise as you are able; even 30 minutes of walking daily can aid in increasing heart health

## 2024-03-29 ENCOUNTER — Other Ambulatory Visit (HOSPITAL_COMMUNITY): Payer: Self-pay

## 2024-03-29 MED ORDER — REPATHA SURECLICK 140 MG/ML ~~LOC~~ SOAJ: 140.0000 mg | SUBCUTANEOUS | 2 refills | Status: AC

## 2024-03-29 NOTE — Addendum Note (Signed)
 Addended by: Tequan Redmon E on: 03/29/2024 12:17 PM   Modules accepted: Orders

## 2024-03-29 NOTE — Telephone Encounter (Signed)
 Pharmacy Patient Advocate Encounter  Received notification from [employer] that Prior Authorization for repatha  has been APPROVED from 03/29/24 to 03/29/25. Ran test claim, Copay is $34.99- one month. This test claim was processed through [facility]- copay amounts may vary at other pharmacies due to pharmacy/plan contracts, or as the patient moves through the different stages of their insurance plan.   PA #/Case ID/Reference #: [id number]
